# Patient Record
Sex: Female | Born: 1950 | Hispanic: Yes | Smoking: Never smoker
Health system: Southern US, Community
[De-identification: ages and names within clinical notes are randomized; demographics above are authoritative.]

## PROBLEM LIST (undated history)

## (undated) DIAGNOSIS — F22 Delusional disorders: Secondary | ICD-10-CM

## (undated) DIAGNOSIS — K219 Gastro-esophageal reflux disease without esophagitis: Secondary | ICD-10-CM

## (undated) DIAGNOSIS — M810 Age-related osteoporosis without current pathological fracture: Secondary | ICD-10-CM

## (undated) DIAGNOSIS — M8580 Other specified disorders of bone density and structure, unspecified site: Secondary | ICD-10-CM

## (undated) DIAGNOSIS — K149 Disease of tongue, unspecified: Secondary | ICD-10-CM

## (undated) DIAGNOSIS — E785 Hyperlipidemia, unspecified: Secondary | ICD-10-CM

## (undated) DIAGNOSIS — M1990 Unspecified osteoarthritis, unspecified site: Secondary | ICD-10-CM

## (undated) DIAGNOSIS — R7302 Impaired glucose tolerance (oral): Secondary | ICD-10-CM

## (undated) DIAGNOSIS — M5416 Radiculopathy, lumbar region: Secondary | ICD-10-CM

## (undated) HISTORY — DX: Impaired glucose tolerance (oral): R73.02

## (undated) HISTORY — DX: Hyperlipidemia, unspecified: E78.5

## (undated) HISTORY — PX: BREAST BIOPSY: SHX20

## (undated) HISTORY — DX: Delusional disorders: F22

## (undated) HISTORY — DX: Disease of tongue, unspecified: K14.9

## (undated) HISTORY — DX: Radiculopathy, lumbar region: M54.16

## (undated) HISTORY — DX: Age-related osteoporosis without current pathological fracture: M81.0

## (undated) HISTORY — DX: Other specified disorders of bone density and structure, unspecified site: M85.80

---

## 1983-09-20 HISTORY — PX: LIPOMA EXCISION: SHX5283

## 2000-06-20 ENCOUNTER — Encounter: Admission: RE | Admit: 2000-06-20 | Discharge: 2000-06-20 | Payer: Self-pay | Admitting: Family Medicine

## 2000-08-24 ENCOUNTER — Encounter: Admission: RE | Admit: 2000-08-24 | Discharge: 2000-08-24 | Payer: Self-pay | Admitting: Family Medicine

## 2000-09-29 ENCOUNTER — Encounter: Admission: RE | Admit: 2000-09-29 | Discharge: 2000-09-29 | Payer: Self-pay | Admitting: Family Medicine

## 2000-12-11 ENCOUNTER — Encounter: Admission: RE | Admit: 2000-12-11 | Discharge: 2000-12-11 | Payer: Self-pay | Admitting: Family Medicine

## 2004-02-24 ENCOUNTER — Other Ambulatory Visit: Admission: RE | Admit: 2004-02-24 | Discharge: 2004-02-24 | Payer: Self-pay | Admitting: Gynecology

## 2004-04-02 ENCOUNTER — Encounter (INDEPENDENT_AMBULATORY_CARE_PROVIDER_SITE_OTHER): Payer: Self-pay | Admitting: *Deleted

## 2004-04-02 ENCOUNTER — Encounter: Admission: RE | Admit: 2004-04-02 | Discharge: 2004-04-02 | Payer: Self-pay | Admitting: Surgery

## 2004-05-20 ENCOUNTER — Encounter: Admission: RE | Admit: 2004-05-20 | Discharge: 2004-05-20 | Payer: Self-pay | Admitting: Gastroenterology

## 2004-05-28 ENCOUNTER — Ambulatory Visit (HOSPITAL_COMMUNITY): Admission: RE | Admit: 2004-05-28 | Discharge: 2004-05-28 | Payer: Self-pay | Admitting: Gastroenterology

## 2004-05-28 HISTORY — PX: COLONOSCOPY: SHX174

## 2004-07-06 ENCOUNTER — Encounter: Admission: RE | Admit: 2004-07-06 | Discharge: 2004-07-06 | Payer: Self-pay | Admitting: Surgery

## 2005-01-25 ENCOUNTER — Encounter: Admission: RE | Admit: 2005-01-25 | Discharge: 2005-01-25 | Payer: Self-pay | Admitting: Surgery

## 2005-09-07 ENCOUNTER — Encounter: Admission: RE | Admit: 2005-09-07 | Discharge: 2005-09-07 | Payer: Self-pay | Admitting: Surgery

## 2006-03-27 ENCOUNTER — Encounter: Admission: RE | Admit: 2006-03-27 | Discharge: 2006-03-27 | Payer: Self-pay | Admitting: Surgery

## 2006-08-23 IMAGING — RF DG ESOPHAGUS
11 of 13 series · 20 of 24 positions shown · non-contrast
Comparison: none

CLINICAL DATA: Dysphagia. 
DOUBLE CONTRAST BARIUM ESOPHAGRAM: 
Pharynx is unremarkable.  Normal esophageal peristalsis is noted.  No fixed filling defects, areas of fixed narrowing or mucosal abnormalities identified.  No demonstrable episodes of gastroesophageal reflux are noted during the study.   12.5mm barium tablet passed through the esophagus into the stomach without evidence of obstruction.  The patient did have slight difficulty in transport of the tablet from the oral cavity into the pharynx.

[Series 1: run · 7 of 14 slices shown (1 of 11)]
[im 1/14]
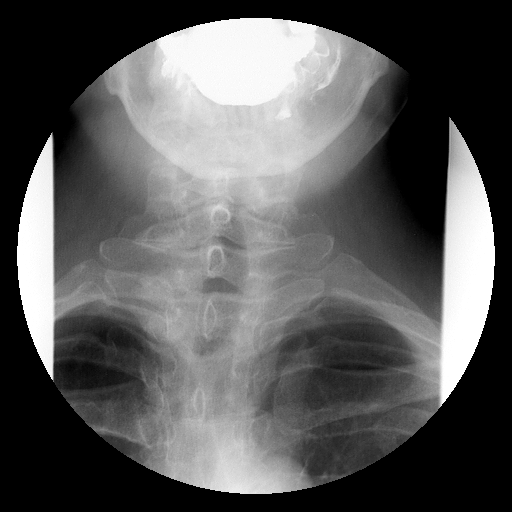
[im 2/14]
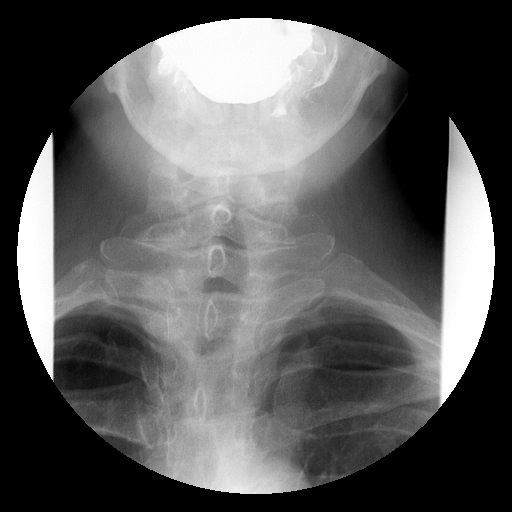
[im 6/14]
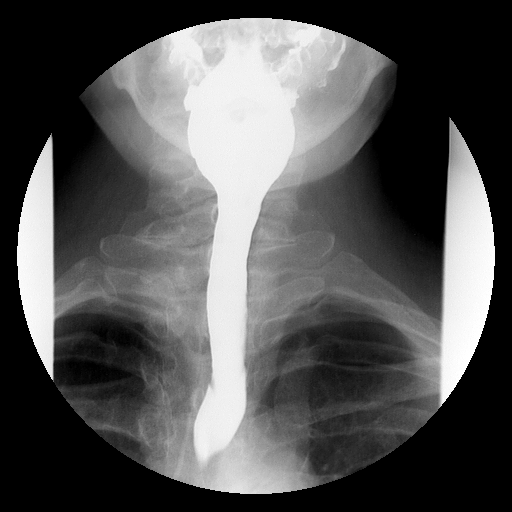
[im 8/14]
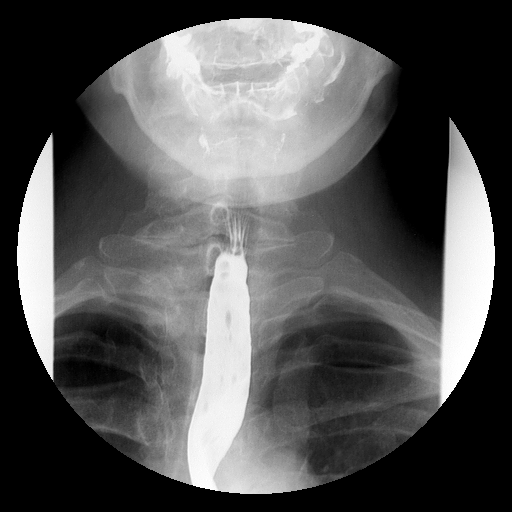
[im 10/14]
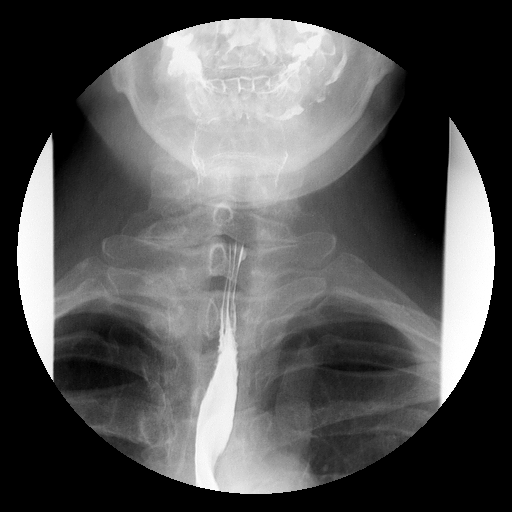
[im 12/14]
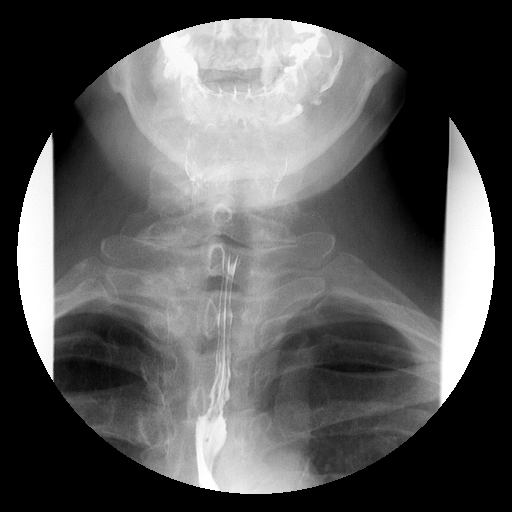
[im 14/14]
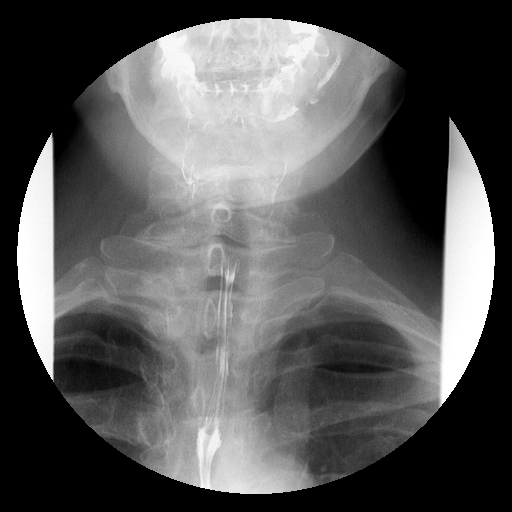

[Series 2: run · 4 of 10 slices shown (2 of 11)]
[im 3/10]
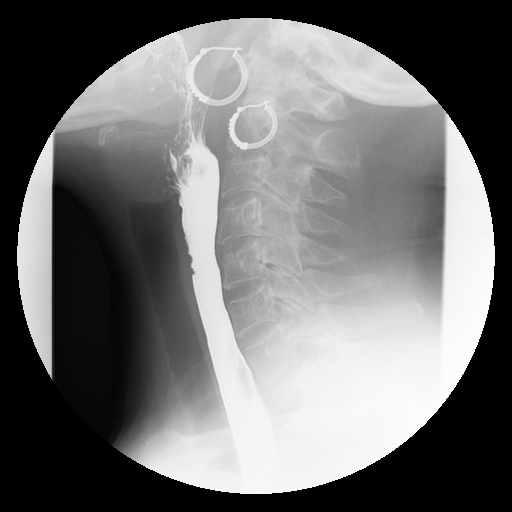
[im 5/10]
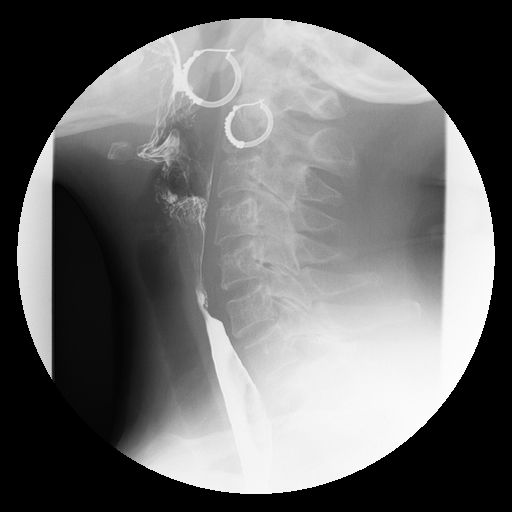
[im 7/10]
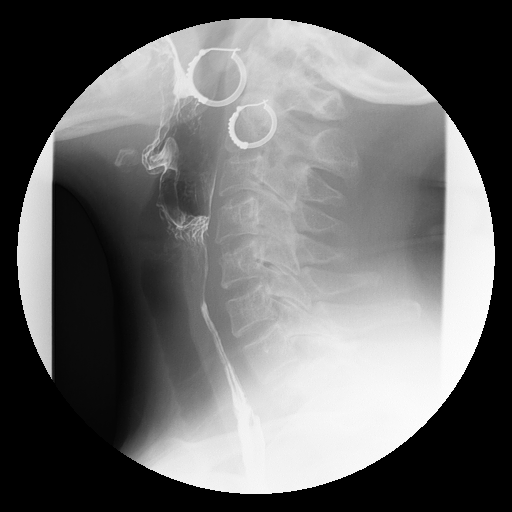
[im 10/10]
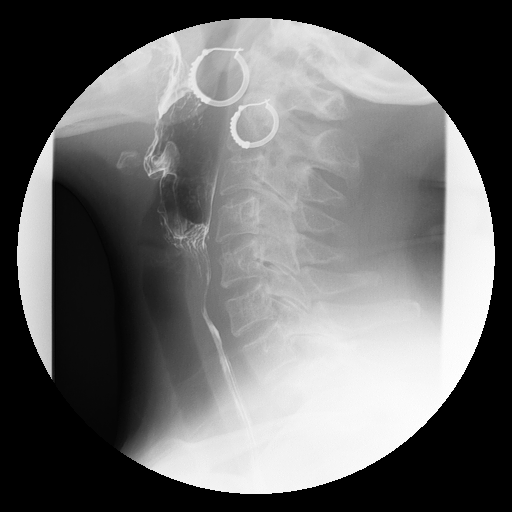

[Series 3: run · 1 of 1 slices shown (3 of 11)]
[im 1/1]
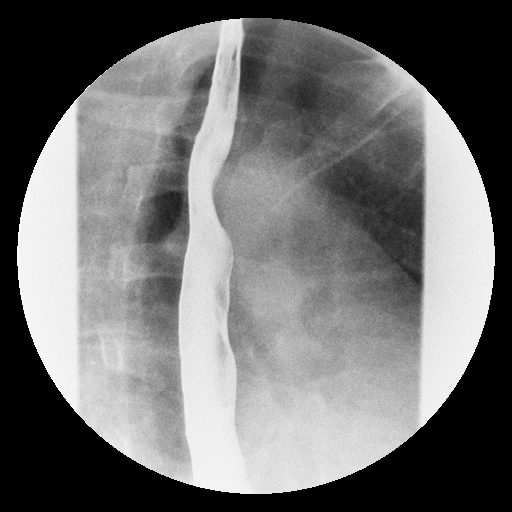

[Series 5: run · 1 of 1 slices shown (4 of 11)]
[im 1/1]
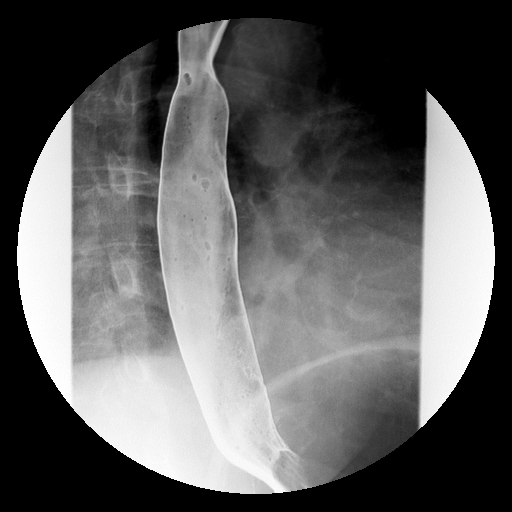

[Series 6: run · 1 of 1 slices shown (5 of 11)]
[im 1/1]
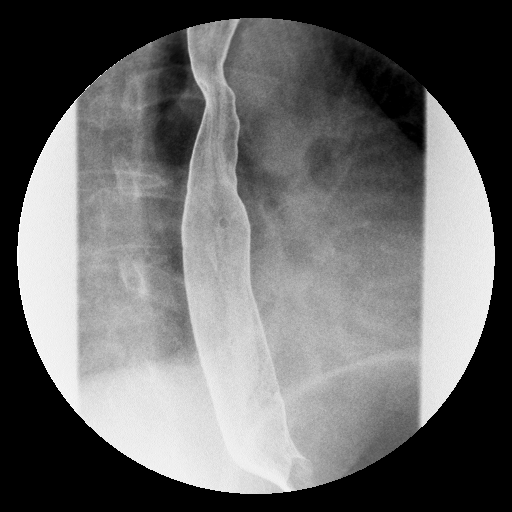

[Series 7: run · 1 of 1 slices shown (6 of 11)]
[im 1/1]
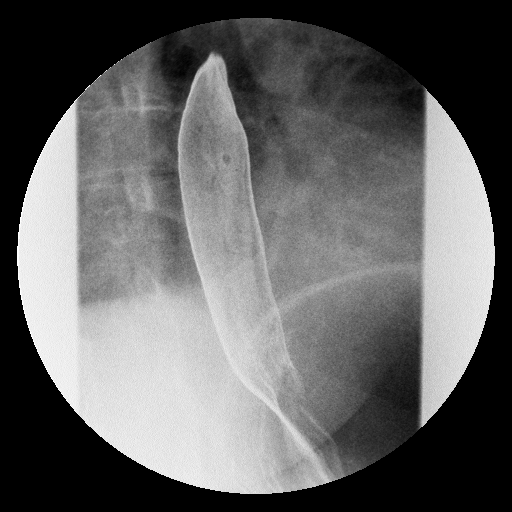

[Series 8: run · 1 of 1 slices shown (7 of 11)]
[im 1/1]
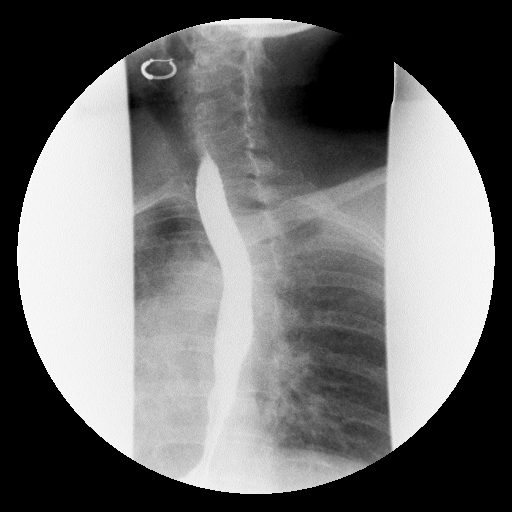

[Series 9: run · 1 of 1 slices shown (8 of 11)]
[im 1/1]
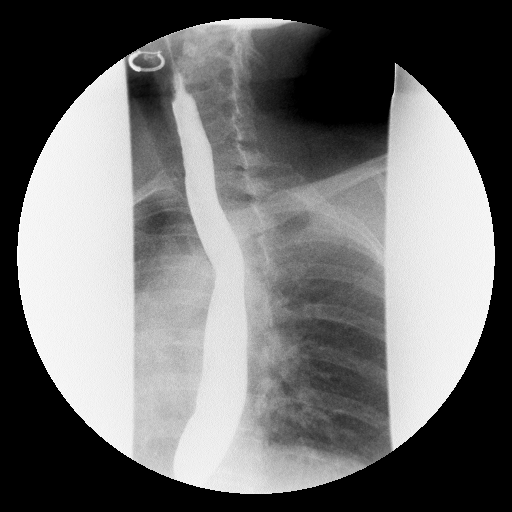

[Series 11: run · 1 of 1 slices shown (9 of 11)]
[im 1/1]
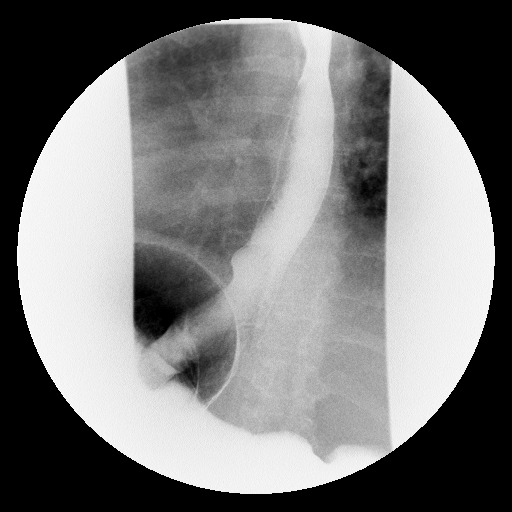

[Series 12: run · 1 of 1 slices shown (10 of 11)]
[im 1/1]
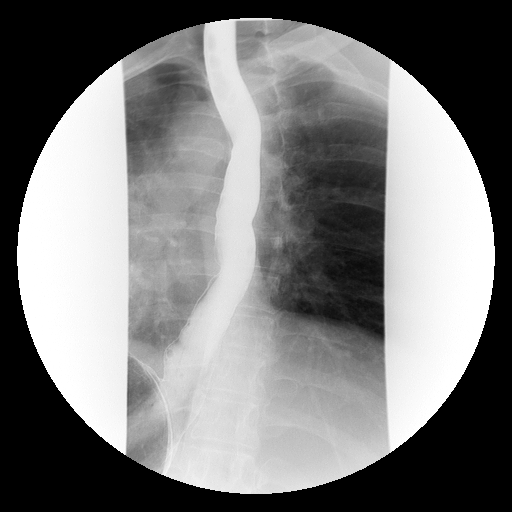

[Series 13: run · 1 of 1 slices shown (11 of 11)]
[im 1/1]
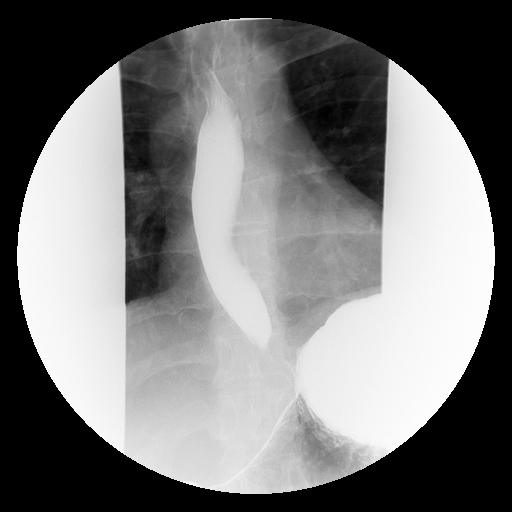

[20 of 24 positions shown; findings below may reference images not displayed]

IMPRESSION: Mild difficulty in oral-pharyngeal transport of barium tablet, otherwise unremarkable study.

## 2006-09-19 DIAGNOSIS — M8580 Other specified disorders of bone density and structure, unspecified site: Secondary | ICD-10-CM

## 2006-09-19 HISTORY — DX: Other specified disorders of bone density and structure, unspecified site: M85.80

## 2007-05-11 ENCOUNTER — Encounter: Admission: RE | Admit: 2007-05-11 | Discharge: 2007-05-11 | Payer: Self-pay | Admitting: Family Medicine

## 2008-06-02 ENCOUNTER — Ambulatory Visit (HOSPITAL_COMMUNITY): Admission: RE | Admit: 2008-06-02 | Discharge: 2008-06-02 | Payer: Self-pay | Admitting: Orthopedic Surgery

## 2008-07-25 ENCOUNTER — Encounter: Admission: RE | Admit: 2008-07-25 | Discharge: 2008-07-25 | Payer: Self-pay | Admitting: Family Medicine

## 2010-01-04 ENCOUNTER — Ambulatory Visit: Payer: Self-pay | Admitting: Family Medicine

## 2010-01-04 DIAGNOSIS — M810 Age-related osteoporosis without current pathological fracture: Secondary | ICD-10-CM | POA: Insufficient documentation

## 2010-01-04 DIAGNOSIS — B351 Tinea unguium: Secondary | ICD-10-CM

## 2010-01-04 DIAGNOSIS — M549 Dorsalgia, unspecified: Secondary | ICD-10-CM | POA: Insufficient documentation

## 2010-01-04 DIAGNOSIS — K219 Gastro-esophageal reflux disease without esophagitis: Secondary | ICD-10-CM | POA: Insufficient documentation

## 2010-01-20 ENCOUNTER — Encounter: Payer: Self-pay | Admitting: Family Medicine

## 2010-01-20 DIAGNOSIS — E663 Overweight: Secondary | ICD-10-CM

## 2010-01-21 ENCOUNTER — Encounter: Payer: Self-pay | Admitting: Family Medicine

## 2010-01-21 ENCOUNTER — Ambulatory Visit: Payer: Self-pay | Admitting: Family Medicine

## 2010-01-21 LAB — CONVERTED CEMR LAB
ALT: 51 units/L — ABNORMAL HIGH (ref 0–35)
AST: 36 units/L (ref 0–37)
Albumin: 4.5 g/dL (ref 3.5–5.2)
Alkaline Phosphatase: 59 units/L (ref 39–117)
BUN: 18 mg/dL (ref 6–23)
CO2: 25 meq/L (ref 19–32)
Calcium: 9 mg/dL (ref 8.4–10.5)
Chloride: 104 meq/L (ref 96–112)
Cholesterol: 197 mg/dL (ref 0–200)
Creatinine, Ser: 0.79 mg/dL (ref 0.40–1.20)
Glucose, Bld: 98 mg/dL (ref 70–99)
HDL: 53 mg/dL (ref >39)
LDL Cholesterol: 116 mg/dL — ABNORMAL HIGH (ref 0–99)
Potassium: 4.1 meq/L (ref 3.5–5.3)
Sodium: 139 meq/L (ref 135–145)
Total Bilirubin: 0.4 mg/dL (ref 0.3–1.2)
Total CHOL/HDL Ratio: 3.7
Total Protein: 7 g/dL (ref 6.0–8.3)
Triglycerides: 140 mg/dL (ref <150)
VLDL: 28 mg/dL (ref 0–40)

## 2010-02-08 ENCOUNTER — Ambulatory Visit: Payer: Self-pay | Admitting: Family Medicine

## 2010-02-08 ENCOUNTER — Encounter: Payer: Self-pay | Admitting: Family Medicine

## 2010-02-16 ENCOUNTER — Encounter: Payer: Self-pay | Admitting: Family Medicine

## 2010-02-16 LAB — CONVERTED CEMR LAB
ALT: 36 units/L — ABNORMAL HIGH (ref 0–35)
AST: 27 units/L (ref 0–37)
Albumin: 4.3 g/dL (ref 3.5–5.2)
Alkaline Phosphatase: 55 units/L (ref 39–117)
Bilirubin, Direct: 0.1 mg/dL (ref 0.0–0.3)
Indirect Bilirubin: 0.6 mg/dL (ref 0.0–0.9)
Total Bilirubin: 0.7 mg/dL (ref 0.3–1.2)
Total Protein: 7 g/dL (ref 6.0–8.3)

## 2010-02-22 ENCOUNTER — Ambulatory Visit: Payer: Self-pay | Admitting: Family Medicine

## 2010-02-22 ENCOUNTER — Encounter: Payer: Self-pay | Admitting: Family Medicine

## 2010-02-22 LAB — CONVERTED CEMR LAB: H Pylori IgG: NEGATIVE

## 2010-02-24 LAB — CONVERTED CEMR LAB
ALT: 46 units/L — ABNORMAL HIGH (ref 0–35)
AST: 37 units/L (ref 0–37)
Albumin: 4.6 g/dL (ref 3.5–5.2)
Alkaline Phosphatase: 56 units/L (ref 39–117)
Bilirubin, Direct: 0.2 mg/dL (ref 0.0–0.3)
HCV Ab: NEGATIVE
Hep B S Ab: NEGATIVE
Hepatitis B Surface Ag: NEGATIVE
Indirect Bilirubin: 0.7 mg/dL (ref 0.0–0.9)
Total Bilirubin: 0.9 mg/dL (ref 0.3–1.2)
Total Protein: 7.2 g/dL (ref 6.0–8.3)

## 2010-03-01 ENCOUNTER — Ambulatory Visit (HOSPITAL_COMMUNITY): Admission: RE | Admit: 2010-03-01 | Discharge: 2010-03-01 | Payer: Self-pay | Admitting: Family Medicine

## 2010-03-01 ENCOUNTER — Encounter: Payer: Self-pay | Admitting: Family Medicine

## 2010-03-08 ENCOUNTER — Ambulatory Visit: Payer: Self-pay | Admitting: Family Medicine

## 2010-03-08 ENCOUNTER — Encounter: Payer: Self-pay | Admitting: Family Medicine

## 2010-03-08 LAB — CONVERTED CEMR LAB
Ferritin: 193 ng/mL (ref 10–291)
HCT: 41.8 % (ref 36.0–46.0)
Hemoglobin: 14 g/dL (ref 12.0–15.0)
Iron: 96 ug/dL (ref 42–145)
MCHC: 33.5 g/dL (ref 30.0–36.0)
MCV: 91.1 fL (ref 78.0–100.0)
Platelets: 223 10*3/uL (ref 150–400)
RBC: 4.59 M/uL (ref 3.87–5.11)
RDW: 14 % (ref 11.5–15.5)
Saturation Ratios: 25 % (ref 20–55)
TIBC: 377 ug/dL (ref 250–470)
UIBC: 281 ug/dL
WBC: 6.5 10*3/uL (ref 4.0–10.5)

## 2010-03-23 ENCOUNTER — Ambulatory Visit: Payer: Self-pay | Admitting: Family Medicine

## 2010-04-05 ENCOUNTER — Ambulatory Visit (HOSPITAL_COMMUNITY): Admission: RE | Admit: 2010-04-05 | Discharge: 2010-04-05 | Payer: Self-pay | Admitting: Family Medicine

## 2010-06-14 ENCOUNTER — Ambulatory Visit: Payer: Self-pay | Admitting: Family Medicine

## 2010-06-14 DIAGNOSIS — G43909 Migraine, unspecified, not intractable, without status migrainosus: Secondary | ICD-10-CM | POA: Insufficient documentation

## 2010-06-14 DIAGNOSIS — R42 Dizziness and giddiness: Secondary | ICD-10-CM

## 2010-09-09 ENCOUNTER — Encounter: Payer: Self-pay | Admitting: Family Medicine

## 2010-10-10 ENCOUNTER — Encounter: Payer: Self-pay | Admitting: Family Medicine

## 2010-10-10 ENCOUNTER — Encounter: Payer: Self-pay | Admitting: Surgery

## 2010-10-11 ENCOUNTER — Ambulatory Visit: Admission: RE | Admit: 2010-10-11 | Discharge: 2010-10-11 | Payer: Self-pay | Source: Home / Self Care

## 2010-10-11 ENCOUNTER — Encounter: Payer: Self-pay | Admitting: Family Medicine

## 2010-10-11 DIAGNOSIS — R609 Edema, unspecified: Secondary | ICD-10-CM | POA: Insufficient documentation

## 2010-10-11 LAB — CONVERTED CEMR LAB: TSH: 1.406 microintl units/mL (ref 0.350–4.500)

## 2010-10-19 NOTE — Assessment & Plan Note (Signed)
Summary: VOMIT/EAR PROBLEM/MJ   Vital Signs:  Patient profile:   60 year old female Height:      62 inches Weight:      158 pounds BMI:     29.00 Temp:     98.1 degrees F oral Pulse rate:   76 / minute BP sitting:   139 / 76  (right arm) Cuff size:   regular  Vitals Entered By: Tessie Fass CMA (June 14, 2010 3:01 PM) CC: dizziness and vomiting x 6 days ago Pain Assessment Patient in pain? yes     Location: head   Primary Care Provider:  Zachery Dauer, MD  CC:  dizziness and vomiting x 6 days ago.  History of Present Illness: 60 yo F:  1. Dizziness, HA, N/V - x 6 days ago, none since. Patient states that she developed nausea and sensation of room spinning, then HA - left sided, pounding, with photo/phonophobia. This lasted a few hours, then resolved and she has had none since. Today, she denies HA, dizziness, vertigo, vision changes, CP, SOB, N/V/D/C, LE edema, rash. She states that this happened once before, about 11 years ago, and her left TM was found to be ruptured.  Current Medications (verified): 1)  Calcium 600/vitamin D 600-400 Mg-Unit Tabs (Calcium Carbonate-Vitamin D) .Marland Kitchen.. 1 Tab By Mouth Two Times A Day For Osteoporosis 2)  Loratadine 10 Mg Tabs (Loratadine) .... Sig: Take 1 Tab By Mouth One Time Daily Spanish Language Instructions 3)  Zegerid Otc 20-1100 Mg Caps (Omeprazole-Sodium Bicarbonate) .... Take One To Two Tabs Daily  Allergies (verified): No Known Drug Allergies PMH-FH-SH reviewed for relevance  Review of Systems      See HPI  Physical Exam  General:  Well-developed, well-nourished, in no acute distress; alert, appropriate and cooperative throughout examination. Vitals reviewed. Head:  Normocephalic and atraumatic without obvious abnormalities.  Eyes:  No corneal or conjunctival inflammation noted. EOMI. Perrla. Funduscopic exam benign, without hemorrhages, exudates or papilledema. Vision grossly normal. Ears:  R ear normal and L ear normal -  scar tissue on left TM.   Nose:  External nasal examination shows no deformity or inflammation. Nasal mucosa are pink and moist without lesions or exudates. Mouth:  Oral mucosa and oropharynx without lesions or exudates.  Teeth in good repair. Neck:  No deformities, masses, or tenderness noted. Lungs:  Normal respiratory effort, chest expands symmetrically. Lungs are clear to auscultation, no crackles or wheezes. Heart:  Normal rate and regular rhythm. S1 and S2 normal without gallop, murmur, click, rub or other extra sounds. Pulses:  2+ DP. Neurologic:  Alert & oriented X3, cranial nerves II-XII intact, strength normal in all extremities, sensation intact to light touch, and gait normal.     Impression & Recommendations:  Problem # 1:  MIGRAINE HEADACHE (ICD-346.90) Assessment Improved  Resolved. Red Flags given.  Orders: [facility]- Est Level  3 (01093)  Problem # 2:  VERTIGO (ICD-780.4) Assessment: Improved  Resolved. Red Flags given. Advised patient to follow-up during episode if this happens again. Her updated medication list for this problem includes:    Loratadine 10 Mg Tabs (Loratadine) ..... Sig: take 1 tab by mouth one time daily spanish language instructions  Orders: [facility]- Est Level  3 ([id number])  Complete Medication List: 1)  Calcium 600/vitamin D 600-400 Mg-unit Tabs (Calcium carbonate-vitamin d) .Marland Kitchen.. 1 tab by mouth two times a day for osteoporosis 2)  Loratadine 10 Mg Tabs (Loratadine) .... Sig: take 1 tab by mouth one time daily spanish  language instructions 3)  Zegerid Otc 20-1100 Mg Caps (Omeprazole-sodium bicarbonate) .... Take one to two tabs daily  Patient Instructions: 1)  It was nice to meet you today! 2)  Call if you have the dizziness, nausea, and vomiting again - you can come in for a work-in appointment. 3)  Go to the ER if you have drooping face, weakness, or cannot stop vomiting, or if you are having a severe, sudden headache. 4)  Fue Holiday representative. 5)   LLame si coninua con el mareo, nausea y vomito otra vez. 6)  Vaya a la sala de urgencia si la cara siente que se le cuelga,. no deja de vomitar, y si tiene un repentivo y Pharmacist, hospital de Training and development officer.

## 2010-10-19 NOTE — Miscellaneous (Signed)
Summary: Fasting Lipid Panel Order  Clinical Lists Changes  Problems: Added new problem of OVERWEIGHT (ICD-278.02) Orders: Added new Test order of Lipid-[facility] [id number]) - Signed Added new Test order of Comp Met-[facility] [phone]) - Signed

## 2010-10-19 NOTE — Letter (Signed)
Summary: Generic Letter  [facility]  [address]   [place], [place] [address]   Phone: [phone]  Fax: [phone]    02/16/2010  [facility] [address] [place], [place]  [address]  [place] MANCERA-ROSALES,   Espero que esta carta le [employer].  Acabo de revisar sus laboratorios de la semana pasada, y veo que la prueba del higado esta' un poco elevada todavia.    Quiero hacerle una prueba para hepatitis viral. Elfredia Nevins que pase por el consultorio cuando sea conveniente para [employer]; no tiene que estar en ayunas para [employer] prueba.   Por favor llame con cualquier pregunta.     Sinceramente,   Paula Compton MD  Appended Document: Generic Letter mailed.

## 2010-10-19 NOTE — Assessment & Plan Note (Signed)
Summary: F/U lab/mj   Vital Signs:  Patient profile:   60 year old female Height:      62 inches Weight:      155 pounds BMI:     28.45 Temp:     97.7 degrees F oral Pulse rate:   73 / minute BP sitting:   128 / 81  (right arm) Cuff size:   regular  Vitals Entered By: Tessie Fass CMA (February 22, 2010 11:06 AM) CC: F/U labs Is Patient Diabetic? No   Primary Care Provider:  Zachery Dauer, MD  CC:  F/U labs.  History of Present Illness: CC: f/u abnl ALT  Visit conducted in [place].   60yo with h/o elevated ALT (see previous note) and GERD.  1. ALT - elevation continues.  to get viral hepatitis panel today as per plan outlined by Dr. Mauricio Po last visit.  No abd pain.  No EtOH, smoking.  Father with cirrhosis.  No tattoos.  Never received a blood transfusion.   2. throat issue - h/o gerd, treated with omeprazole and protonix and flonase and nasonex.  didn't improve.  staying away from caffeine, sodas, spicy foods, mints, etc.  knows GERD precautions.  Using pillow underneath mattress.  Feels burning sensation and globus hystericus in throat, at times awakens hoarse.  No dysphagia.  + Postnasal drip.  Failed nasonex trial x 3 bottles.  Postponing start of oral Lamisil for her onychomycosis.   Due for mammogram (abnormality 2009 s/p normal biopsy).  Medications reviewed and updated in medication list.  Review of systems as above.   Habits & Providers  Alcohol-Tobacco-Diet     Alcohol drinks/day: 0     Tobacco Status: never     Tobacco Counseling: not indicated; no tobacco use  Exercise-Depression-Behavior     Drug Use: never  Current Medications (verified): 1)  Omeprazole 40 Mg Cpdr (Omeprazole) .Marland Kitchen.. 1 Tab By Mouth Daily For Gerd.  Spanish Instructions. 2)  Calcium 600/vitamin D 600-400 Mg-Unit Tabs (Calcium Carbonate-Vitamin D) .Marland Kitchen.. 1 Tab By Mouth Two Times A Day For Osteoporosis 3)  Loratadine 10 Mg Tabs (Loratadine) .... Sig: Take 1 Tab By Mouth One Time Daily Spanish  Language Instructions  Allergies (verified): No Known Drug Allergies  Past History:  Past medical, surgical, family and social histories (including risk factors) reviewed for relevance to current acute and chronic problems.  Past Medical History: isolated elevated AST h/o cyst right chest, again breast, s/p biopsy (last mammo 07/25/2008)  Family History: Reviewed history from 11/16/2006 and no changes required. cirrhosis - F died, Htn - M  Social History: Reviewed history from 01/04/2010 and no changes required. Employed at [employer].  Divorced.  Non-smoker, non-drinker. Never Smoked [employer] faith Education 9+ yearsDrug Use:  never  Physical Exam  General:  Well-developed,well-nourished,in no acute distress; alert,appropriate and cooperative throughout examination Ears:  External ear exam shows no significant lesions or deformities.  Otoscopic examination reveals clear canals, tympanic membranes are intact bilaterally without bulging, retraction, inflammation or discharge. Hearing is grossly normal bilaterally. Nose:  mucus disharge on walls of nasal turbinates.  No blood. No maxillary or frontal sinus tenderness.  swollen nasal turbinates Mouth:  Oral mucosa and oropharynx without lesions or exudates.  Teeth in good repair.  no pharyngeal abnormality Neck:  No deformities, masses, or tenderness noted. No anterior cervical adenopathy.  Lungs:  Normal respiratory effort, chest expands symmetrically. Lungs are clear to auscultation, no crackles or wheezes. Heart:  Normal rate and regular rhythm. S1 and  S2 normal without gallop, murmur, click, rub or other extra sounds.   Impression & Recommendations:  Problem # 1:  LIVER FUNCTION TESTS, ABNORMAL, HX OF (ICD-V12.2) check viral hepatitis panel.  rtc 2 wks for f/u.  may need baseline RUQ [place] and iron studies if viral hepatitis panel negative.  father with h/o non-EtOH cirrhosis.  Orders: Hep Bs Ab-[facility] ([id number]) Hep Bs Ag-[facility]  ([id number]) Hep C Ab-[facility] ([id number]) [facility]- Est  Level 4 ([id number])  Problem # 2:  GERD (ICD-530.81) H pylori negative.  unclear etiology of throat soreness/GERD which has been longstanding x 9 years.  consider ENT referral vs barium swallow.  no dysphagia.  all symptomatology stems from back of throat.  + PND but has failed what sounds like adequate trial of nasal steroids.  on loratadine.  also has tried saline sprays.  consider recommending neti pot.  Her updated medication list for this problem includes:    Omeprazole 40 Mg Cpdr (Omeprazole) .Marland Kitchen... 1 tab by mouth daily for gerd.  spanish instructions.  Orders: H pylori-[facility] ([id number]) [facility]- Est  Level 4 (28413)  Problem # 3:  ABNORMAL MAMMOGRAM (ICD-793.80) past due for mammogram.  advised to go to [facility] where there is translator services.  also as pt has debra [place].  Complete Medication List: 1)  Omeprazole 40 Mg Cpdr (Omeprazole) .Marland Kitchen.. 1 tab by mouth daily for gerd.  spanish instructions. 2)  Calcium 600/vitamin D 600-400 Mg-unit Tabs (Calcium carbonate-vitamin d) .Marland Kitchen.. 1 tab by mouth two times a day for osteoporosis 3)  Loratadine 10 Mg Tabs (Loratadine) .... Sig: take 1 tab by mouth one time daily spanish language instructions  Patient Instructions: 1)  Return in 2 wks for f/u. 2)  regresar conmigo o Dr. Mauricio Po o Dr. Sheffield Slider en 2 semanas para ver los resultados de examen viral de hepatitis. 3)  Marlana Latus en [facility]. 4)  Gusto conocerla hoy.  Llame a clinica con preguntas.  Laboratory Results   Blood Tests   Date/Time Received: February 22, 2010 12:22 PM  Date/Time Reported: February 22, 2010 1:01 PM    H. pylori: negative Comments: ...........test performed by...........Marland KitchenTerese Door, CMA

## 2010-10-19 NOTE — Assessment & Plan Note (Signed)
Summary: cpp/[place]/olson   Vital Signs:  Patient profile:   60 year old female Weight:      158 pounds Pulse rate:   72 / minute BP sitting:   130 / 70  (right arm)  Vitals Entered By: Arlyss Repress CMA, (Feb 08, 2010 2:18 PM) CC: f/up labs   Primary Care Provider:  Zachery Dauer, MD  CC:  f/up labs.  History of Present Illness: Visit conducted in [place].   Ms. Ann Chase comes in today for followup of labs done at last visit.  She reports that she continues to have the irritation in the back of her throat, "polvillo", which has not gone away since changing her meds at the last visit.  Denies stomach discomfort or discernible symptoms of reflux.  Says she gets irritated throat, provokes cough.  Nonproductive cough.  Has tried to limit consumption of onion, coffee and tea.  Not much improvement.  She says there may be a seasonal worsening of the symptoms during the winter months, although it's been pretty bad lately.    Of note her ALT is mildly elevated.  She does not know of this ever being found before.  She denies any prior history of viral hepatitis, although her father died of cirrhosis that was reportedly the result of viral hepatitis.  He never drank alcohol.  She has never been a smoker; no alcohol or drugs.  No tattoos.  Never received a blood transfusion.   She is interested in starting oral Lamisil for her onychomycosis.  Discussed why we might want to wait until better understanding of elevated transaminases.   Current Medications (verified): 1)  Omeprazole 40 Mg Cpdr (Omeprazole) .Marland Kitchen.. 1 Tab By Mouth Daily For Gerd.  Spanish Instructions. 2)  Calcium 600/vitamin D 600-400 Mg-Unit Tabs (Calcium Carbonate-Vitamin D) .Marland Kitchen.. 1 Tab By Mouth Two Times A Day For Osteoporosis 3)  Loratadine 10 Mg Tabs (Loratadine) .... Sig: Take 1 Tab By Mouth One Time Daily Spanish Language Instructions  Allergies (verified): No Known Drug Allergies  Physical Exam  General:   Well-developed,well-nourished,in no acute distress; alert,appropriate and cooperative throughout examination Eyes:  clear sclerae.  Injected conjunctivae bilat.  Ears:  External ear exam shows no significant lesions or deformities.  Otoscopic examination reveals clear canals, tympanic membranes are intact bilaterally without bulging, retraction, inflammation or discharge. Hearing is grossly normal bilaterally. Nose:  mucus disharge on walls of nasal turbinates.  No blood. No maxillary or frontal sinus tenderness.  Mouth:  Oral mucosa and oropharynx without lesions or exudates.  Teeth in good repair. Neck:  No deformities, masses, or tenderness noted. No anterior cervical adenopathy.  Lungs:  Normal respiratory effort, chest expands symmetrically. Lungs are clear to auscultation, no crackles or wheezes. Heart:  Normal rate and regular rhythm. S1 and S2 normal without gallop, murmur, click, rub or other extra sounds. Abdomen:  Bowel sounds positive,abdomen soft and non-tender without masses, organomegaly or hernias noted. Specifically, no hepatomegaly or masses noted on exam.   Impression & Recommendations:  Problem # 1:  LIVER FUNCTION TESTS, ABNORMAL, HX OF (ICD-V12.2) Patient wiht single elevated ALT value.  Only risk factor that can be identified is father wtih hepatitis cirrhosis.  Will recheck ALT today, if remains elevated even slightly, would check viral hepatitis panel (check for HCV, which sometimes has only presenting finding an elevated ALT). Orders: Hepatic-[facility] ([id number]) [facility]- Est Level  3 ([id number])  Problem # 2:  COUGH (ICD-786.2) Coughing symptoms that heretofore have been associated with GERD  symptoms.  She reports today that she does not get relief from omeprazole.  Suspect post-nasal symptoms, will treat with a nonsedating antihistamine and recheck at next visit.  Could consider Flonase or other nasal steroid from [facility] program as well.   Problem # 3:  ONYCHOMYCOSIS  (ICD-110.1) I explained to her that I would prefer to consider treatmentwith oral Lamisil once we have worked up the elevated transaminase value.  If normalizes with this check, then to begin treatment.  If still high, to proceed with workup and defer treatment of onychomycosis.  Orders: [facility]- Est Level  3 ([id number])  Complete Medication List: 1)  Omeprazole 40 Mg Cpdr (Omeprazole) .Marland Kitchen.. 1 tab by mouth daily for gerd.  spanish instructions. 2)  Calcium 600/vitamin D 600-400 Mg-unit Tabs (Calcium carbonate-vitamin d) .Marland Kitchen.. 1 tab by mouth two times a day for osteoporosis 3)  Loratadine 10 Mg Tabs (Loratadine) .... Sig: take 1 tab by mouth one time daily spanish language instructions  Patient Instructions: 1)  Fue un placer verle hoy.  Estamos chequeando las enzimas del higado otra vez, y le hago contacto o por carta o telefono [phone]) con los [place]. 2)  Si las pruebas salen bien, podemos iniciar el tratamiento para el hongo en las unas de los pies; si estan elevadas las enzimas del higado otra vez, vamos a hacer una investigacion mas profunda. 3)  Mande' una receta para Loratadina 10mg , para tomar una tableta cada manana a ver si mejora la sensacion de polvillo en la garganta.  Si no se resuelve, podriamos tratar con un espray nasal (Flonase), a ver si esta' disponible a traves del programa de MAP en [facility]. 4)  Quiero que vuelva en 4 a 6 meses para chequear lo de Administrator.  POR FAVOR TRAIGA CUALQUIER INFORMACION ACERCA DEL LUGAR DONDE LE HICIERON LA COLONOSCOPIA HACE 4 ANOS, TAMBIEN LA MAMOGRAFIA Y EL PAPANICOLAU. 5)  FOLLOW UP IN 4 TO 6 WEEKS WITH DR Mauricio Po. Prescriptions: LORATADINE 10 MG TABS (LORATADINE) SIG: Take 1 tab by mouth one time daily SPanish language instructions  #30 x 3   Entered and Authorized by:   Paula Compton MD   Signed by:   Paula Compton MD on 02/08/2010   Method used:   Electronically to        [facility] [address] (retail)        [phone]       [place], [place]  [address]       Ph: [phone]       Fax: [phone]   RxID:   [id number]

## 2010-10-19 NOTE — Assessment & Plan Note (Signed)
Summary: NP,tcb   Vital Signs:  Patient profile:   60 year old female Height:      62 inches Weight:      157 pounds BMI:     28.82 Pulse rate:   80 / minute BP sitting:   123 / 80  (right arm)  Vitals Entered By: Arlyss Repress CMA, (January 04, 2010 3:34 PM) CC: new pt. Is Patient Diabetic? No Pain Assessment Patient in pain? no        Primary Care Provider:  Zachery Dauer, MD  CC:  new pt..  History of Present Illness: 60 yo female presenting to establish care.  GERD (ICD-530.81) Describes acid and mucousy feeling in the throat for several years.  Has tried Ranitidine and Omeprazole (currently taking Omeprazole) without substantial relief.  Pain makes it difficult to sleep at night at times, though the stomach and throat hurt day and night.  No cough, occasional dyspnea and nasal congestion.  Does not drink alcohol or use tobacco, but does drink sodas and juice several times a day, and enjoys chocolate and mints.  Also uses Alendronate by mouth for osteoporosis, which exacerbates symptoms (see below).  OSTEOPOROSIS (ICD-733.00) Formerly taking Alendronate 70 mg by mouth weekly, but is out.  She says this causes terrible stomach and esophagus burning when she takes it.  ONYCHOMYCOSIS (ICD-110.1) Bilateral toenails.  Taking no medicine for this.  Used to use a cream without relief.  BACK PAIN (ICD-724.5) Resolved.  In the past received injections in the back which were very costly but provided no relief.  Has since gone to the chiropractor  ~8 times and has gotten good relief from this.  Today denies symptoms.  Habits & Providers  Alcohol-Tobacco-Diet     Tobacco Status: never     Tobacco Counseling: to quit use of tobacco products  Current Medications (verified): 1)  Omeprazole 40 Mg Cpdr (Omeprazole) .Marland Kitchen.. 1 Tab By Mouth Daily For Gerd.  Spanish Instructions. 2)  Calcium 600/vitamin D 600-400 Mg-Unit Tabs (Calcium Carbonate-Vitamin D) .Marland Kitchen.. 1 Tab By Mouth Two Times A Day  For Osteoporosis  Allergies (verified): No Known Drug Allergies  Social History:    Employed at [employer].  Divorced.  Non-smoker, non-drinker.    Never Smoked    Catholic faith    Education 9+ years    Smoking Status:  never  Physical Exam  General:  Well-developed,well-nourished,in no acute distress; alert,appropriate and cooperative throughout examination Ears:  External ear exam shows no significant lesions or deformities.  Otoscopic examination reveals clear canals, tympanic membranes are intact bilaterally without bulging, retraction, inflammation or discharge. Hearing is grossly normal bilaterally. Nose:  External nasal examination shows no deformity or inflammation. Nasal mucosa are pink and moist without lesions or exudates. Mouth:  Oral mucosa and oropharynx without lesions or exudates. Neck:  No deformities, masses, or tenderness noted. Lungs:  Normal respiratory effort, chest expands symmetrically. Lungs are clear to auscultation, no crackles or wheezes. Heart:  Normal rate and regular rhythm. S1 and S2 normal without gallop, murmur, click, rub or other extra sounds. Abdomen:  Bowel sounds positive,abdomen soft and non-tender without masses, organomegaly or hernias noted. Extremities:  Onychomycosis x10 toenails.   Impression & Recommendations:  Problem # 1:  GERD (ICD-530.81) Assessment Unchanged Discussed dietary changes:  no caffiene, sodas, chocolate, mint for 1 month, then try to add back slowly to see which ones are tolerable and which ones exacerbate symptoms.  Will discontinue Alendronate for Osteoporosis and start Boniva, see #2  below.  Continue PPI. Her updated medication list for this problem includes:    Omeprazole 40 Mg Cpdr (Omeprazole) .Marland Kitchen... 1 tab by mouth daily for gerd.  spanish instructions.  Problem # 2:  OSTEOPOROSIS (ICD-733.00) Assessment: Unchanged Unclear what T-score is.  Would discontinue Alendronate given severe GERD symptoms.  Consider Boniva  IV q52months, but would ensure that dental work is up to date.  Start Calcium/Vitamin D two times a day now.  Her updated medication list for this problem includes:    Calcium 600/vitamin D 600-400 Mg-unit Tabs (Calcium carbonate-vitamin d) .Marland Kitchen... 1 tab by mouth two times a day for osteoporosis  Problem # 3:  ONYCHOMYCOSIS (ICD-110.1) Assessment: New Would start Terbinafine when she returns after qualifying with Rudell Cobb, as will require CMet.  Problem # 4:  BACK PAIN (ICD-724.5) Assessment: Improved No intervention at this time, patient sees chiropractor and is happy with results.  Problem # 5:  Preventive Health Care (ICD-V70.0) Assessment: Comment Only Due for Pap, Colonoscopy, Mammogram, FLP.  Complete Medication List: 1)  Omeprazole 40 Mg Cpdr (Omeprazole) .Marland Kitchen.. 1 tab by mouth daily for gerd.  spanish instructions. 2)  Calcium 600/vitamin D 600-400 Mg-unit Tabs (Calcium carbonate-vitamin d) .Marland Kitchen.. 1 tab by mouth two times a day for osteoporosis  Patient Instructions: 1)  Please see Rudell Cobb to qualify for reduced or free medical services within the [facility].  Call her at [phone] today. 2)  Please schedule a follow-up appointment in 2 weeks for Pap, fasting lipid panel. Prescriptions: OMEPRAZOLE 40 MG CPDR (OMEPRAZOLE) 1 tab by mouth daily for GERD.  Spanish Instructions.  #1 x 3   Entered by:   Romero Belling MD   Authorized by:   Zachery Dauer MD   Signed by:   Romero Belling MD on 01/04/2010   Method used:   Print then Give to Patient   RxID:   [id number]     Prevention & Chronic Care Immunizations   Influenza vaccine: Not documented    Tetanus booster: Not documented    Pneumococcal vaccine: Not documented  Colorectal Screening   Hemoccult: Not documented    Colonoscopy: Not documented  Other Screening   Pap smear: Not documented   Pap smear due: 01/04/2010    Mammogram: Not documented   Mammogram due: 01/04/2010   Smoking status: never   (01/04/2010)  Lipids   Total Cholesterol: Not documented   LDL: Not documented   LDL Direct: Not documented   HDL: Not documented   Triglycerides: Not documented        Social History:    Employed at [employer].  Divorced.  Non-smoker, non-drinker.    Never Smoked    Catholic faith    Education 9+ years    Smoking Status:  never

## 2010-10-19 NOTE — Letter (Signed)
Summary: Results Follow-up Letter  [facility]  [address]   [place], [place] [address]   Phone: [phone]  Fax: [phone]    03/01/2010  [address] [place], [place]  [address]  Dear [name],   The following are the results of your recent test(s):  Test     Result     Su sonograma abdominal salio normal.  Los riones y el higado se veian completamente normales.  Me gustaria que usted regrese a clinica para International aid/development worker su nivel de hierro en la sangre - esto a veces puede causar [employer] del laboratorio del higado esten un poco elevados.  Solo necesita venir para una visita de laboratorio, y despues si puede hacer cita con el doctor en 1 semana despues del laboratorio.  Espero que su garganta este mejor con la nueva medicina Nexium.  Llame a clinica con preguntas.  Sincerely,  Eustaquio Boyden  MD [facility]           Appended Document: Results Follow-up Letter letter mailed.

## 2010-10-19 NOTE — Assessment & Plan Note (Signed)
Summary: fu/mj/ red team   Vital Signs:  Patient profile:   60 year old female Height:      62 inches Weight:      160 pounds BMI:     29.37 Temp:     98.0 degrees F oral Pulse rate:   93 / minute BP sitting:   115 / 72  (left arm) Cuff size:   regular  Vitals Entered By: Tessie Fass CMA (March 23, 2010 4:05 PM)  Primary Care Provider:  Zachery Dauer, MD   History of Present Illness: She continues to have discomfort in her throat with thick mucous. No nasal congestion or cough and medications for allergic rhinitis did not help. She continues on Prevacid that she buys over the counter and has raised the head of her bed 6 inches. Denies dysphagia or change in her voice. Will get heartburn if she doesn't take the PPI. Saw an ENT whose name she can't remember 3 years ago when she still had insurance and no specific problem was found. The problem began 9 years ago and 2 months before she left [place] so she can't relate it to any job exposures.   Allergies: No Known Drug Allergies  Social History: Employed at [employer].  Divorced.  Non-smoker, non-drinker. Lives alone [place] of [place] Never Smoked Catholic faith Education 9+ years  Physical Exam  General:  Well-developed,well-nourished,in no acute distress; alert,appropriate and cooperative throughout examination Eyes:  no injection.   Ears:  External ear exam shows no significant lesions or deformities.  Otoscopic examination reveals clear canals, tympanic membranes are intact bilaterally without bulging, retraction, inflammation or discharge. Hearing is grossly normal bilaterally. Nose:  No mucus disharge on walls of nasal turbinates.  No blood.  Mouth:  Oral mucosa and oropharynx without lesions or exudates.  Teeth in good repair.  no pharyngeal abnormality Neck:  No deformities, masses, or tenderness noted. No anterior cervical adenopathy.  Lungs:  Normal respiratory effort, chest expands symmetrically. Lungs are clear to  auscultation, no crackles or wheezes. Heart:  Normal rate and regular rhythm. S1 and S2 normal without gallop, murmur, click, rub or other extra sounds. Psych:  not anxious appearing but mildly dysphoric affect.     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 One daily for throat. label in spanish Her updated medication list for this problem includes:    Zegerid Otc 20-1100 Mg Caps (Omeprazole-sodium bicarbonate) .Marland Kitchen... Take one to two tabs daily  Orders: [facility]- Est Level  3 (98119)  Problem # 3:  OVERWEIGHT (ICD-278.02)  Orders: [facility]- Est Level  3 (14782)  Complete Medication List: 1)  Calcium 600/vitamin D 600-400 Mg-unit Tabs (Calcium carbonate-vitamin d) .Marland Kitchen.. 1 tab by mouth two times a day for osteoporosis 2)  Loratadine 10 Mg Tabs (Loratadine) .... Sig: take 1 tab by mouth one time daily spanish language instructions 3)  Zegerid Otc 20-1100 Mg Caps (Omeprazole-sodium bicarbonate) .... Take one to two tabs daily  Patient Instructions: 1)  Trate Zegerid para malestar de la garganta. Puede comprar sin receta.  2)  Llamenos con el nombre de la especialista de la garganta.  3)  Call us with the name of the ENT if you want to see him again.

## 2010-10-21 NOTE — Miscellaneous (Signed)
  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

## 2010-10-21 NOTE — Assessment & Plan Note (Signed)
Summary: Ankle swelling/mj   Vital Signs:  Patient profile:   60 year old female Weight:      161.3 pounds Pulse rate:   72 / minute BP sitting:   130 / 80  (right arm)  Vitals Entered By: Arlyss Repress CMA, (October 11, 2010 3:46 PM) CC: swelling of ankles Is Patient Diabetic? No Pain Assessment Patient in pain? no        Primary Care Provider:  Zachery Dauer, MD  CC:  swelling of ankles.  History of Present Illness: 1. Leg swelling:  Pt has had leg swelling off and on for about 5-6 years.  She came in today because it has been going on for so long.  Described as a little bit of swelling around her feet and ankles after she has been on her feet for a long time.  It is also worse when the weather is warm out.    ROS: denies chest pain, shortness of breath, LE pain / redness / warmth  2. Back pain:  This is a chronic condition.  Diagnosed with a herniated disc many years ago.  She had been going to physical therapy that included acupuncture and ultrasound.  It helped but she hasn't been there in months and her back pain is starting to hurt again.  ROS: denies numbness / weakness, radiating pain, loss of bowel / bladder function  Habits & Providers  Alcohol-Tobacco-Diet     Tobacco Status: never  Current Medications (verified): 1)  Calcium 600/vitamin D 600-400 Mg-Unit Tabs (Calcium Carbonate-Vitamin D) .Marland Kitchen.. 1 Tab By Mouth Two Times A Day For Osteoporosis 2)  Loratadine 10 Mg Tabs (Loratadine) .... Sig: Take 1 Tab By Mouth One Time Daily Spanish Language Instructions 3)  Zegerid Otc 20-1100 Mg Caps (Omeprazole-Sodium Bicarbonate) .... Take One To Two Tabs Daily  Allergies: No Known Drug Allergies  Past History:  Past Medical History: Reviewed history from 02/22/2010 and no changes required. isolated elevated AST h/o cyst right chest, again breast, s/p biopsy (last mammo 07/25/2008)  Social History: Reviewed history from 03/23/2010 and no changes required. Employed at  [employer].  Divorced.  Non-smoker, non-drinker. Lives alone [place] of [place] Never Smoked Catholic faith Education 9+ years  Physical Exam  General:  Vitals reviewed.  Well appearing.  No acute distress Mouth:  MMM Neck:  no JVD Lungs:  Normal respiratory effort, chest expands symmetrically. Lungs are clear to auscultation, no crackles or wheezes. Heart:  Normal rate and regular rhythm. S1 and S2 normal without gallop, murmur, click, rub or other extra sounds. Msk:  Low back:  no swelling/redness/deformity.  Full ROM.  Non-tender. Pulses:  2+ DP pulses bilaterally.  Brisk cap refill Extremities:  trace LE edema Neurologic:  strength normal in all extremities, sensation intact to light touch, and gait normal.   Skin:  turgor normal.     Impression & Recommendations:  Problem # 1:  LEG EDEMA, BILATERAL (ICD-782.3) Assessment New Likely benign LE swelling.  No signs of cardiac dysfunction.  Will check TSH.  Advised compression stockings. Orders: [facility] ([id number]) [facility]- Est  Level 4 ([address]11)  Problem # 2:  BACK PAIN (ICD-724.5) Assessment: Deteriorated Worse over the past couple of months.  She was never taught any specific back exercises that she can do.  Will send to PT for some back exercise training. Orders: Physical Therapy Referral (PT) [facility]- Est  Level 4 (91478)  Complete Medication List: 1)  Calcium 600/vitamin D 600-400 Mg-unit Tabs (Calcium carbonate-vitamin d) .Marland KitchenMarland KitchenMarland Kitchen  1 tab by mouth two times a day for osteoporosis 2)  Loratadine 10 Mg Tabs (Loratadine) .... Sig: take 1 tab by mouth one time daily spanish language instructions 3)  Zegerid Otc 20-1100 Mg Caps (Omeprazole-sodium bicarbonate) .... Take one to two tabs daily  Patient Instructions: 1)  We will get you set up with physical therapy for your back 2)  I am going to check your thyroid function to see if that is related to your leg swelling 3)  It is likely related to you holding on to too much fluid 4)  The  best way to manage it is with compression (elastic) stockings 5)  Please schedule a follow up appointment in 3 months   Orders Added: 1)  Physical Therapy Referral [PT] 2)  [facility] [[id number]] 3)  [facility]- Est  Level 4 [[address]]

## 2010-11-01 ENCOUNTER — Ambulatory Visit: Payer: Self-pay | Attending: Family Medicine | Admitting: Physical Therapy

## 2010-11-01 DIAGNOSIS — M542 Cervicalgia: Secondary | ICD-10-CM | POA: Insufficient documentation

## 2010-11-01 DIAGNOSIS — IMO0001 Reserved for inherently not codable concepts without codable children: Secondary | ICD-10-CM | POA: Insufficient documentation

## 2010-11-01 DIAGNOSIS — M545 Low back pain, unspecified: Secondary | ICD-10-CM | POA: Insufficient documentation

## 2010-11-01 DIAGNOSIS — M25659 Stiffness of unspecified hip, not elsewhere classified: Secondary | ICD-10-CM | POA: Insufficient documentation

## 2010-11-03 ENCOUNTER — Ambulatory Visit: Payer: Self-pay | Admitting: Physical Therapy

## 2010-11-08 ENCOUNTER — Encounter: Payer: Self-pay | Admitting: *Deleted

## 2010-11-08 ENCOUNTER — Ambulatory Visit: Payer: Self-pay | Admitting: Physical Therapy

## 2010-11-10 ENCOUNTER — Ambulatory Visit: Payer: Self-pay | Admitting: Physical Therapy

## 2010-11-15 ENCOUNTER — Ambulatory Visit: Payer: Self-pay | Admitting: Physical Therapy

## 2010-11-17 ENCOUNTER — Ambulatory Visit: Payer: Self-pay | Admitting: Physical Therapy

## 2010-11-22 ENCOUNTER — Ambulatory Visit: Payer: Self-pay | Attending: Family Medicine | Admitting: Physical Therapy

## 2010-11-22 DIAGNOSIS — M545 Low back pain, unspecified: Secondary | ICD-10-CM | POA: Insufficient documentation

## 2010-11-22 DIAGNOSIS — M25659 Stiffness of unspecified hip, not elsewhere classified: Secondary | ICD-10-CM | POA: Insufficient documentation

## 2010-11-22 DIAGNOSIS — M542 Cervicalgia: Secondary | ICD-10-CM | POA: Insufficient documentation

## 2010-11-22 DIAGNOSIS — IMO0001 Reserved for inherently not codable concepts without codable children: Secondary | ICD-10-CM | POA: Insufficient documentation

## 2010-11-24 ENCOUNTER — Ambulatory Visit: Payer: Self-pay | Admitting: Physical Therapy

## 2011-01-07 ENCOUNTER — Encounter: Payer: Self-pay | Admitting: Family Medicine

## 2011-01-07 ENCOUNTER — Ambulatory Visit (INDEPENDENT_AMBULATORY_CARE_PROVIDER_SITE_OTHER): Payer: Self-pay | Admitting: Family Medicine

## 2011-01-07 DIAGNOSIS — K219 Gastro-esophageal reflux disease without esophagitis: Secondary | ICD-10-CM

## 2011-01-07 DIAGNOSIS — R609 Edema, unspecified: Secondary | ICD-10-CM

## 2011-01-07 DIAGNOSIS — J309 Allergic rhinitis, unspecified: Secondary | ICD-10-CM

## 2011-01-07 DIAGNOSIS — J302 Other seasonal allergic rhinitis: Secondary | ICD-10-CM | POA: Insufficient documentation

## 2011-01-07 MED ORDER — OMEPRAZOLE-SODIUM BICARBONATE 20-1100 MG PO CAPS: 1.0000 | ORAL_CAPSULE | Freq: Two times a day (BID) | ORAL | Status: DC

## 2011-01-07 NOTE — Assessment & Plan Note (Signed)
Chronic problems.  Says that she has had this for 10 years.  She is not taking the Omeprazole everyday.  Advised her to start taking it twice a day and see if that helps with her symptoms.  If not improved with PPI BID I would consider sending her for an EGD.

## 2011-01-07 NOTE — Assessment & Plan Note (Signed)
Unchanged.  Only trace LE edema.  Not using any compression stockings.  Advised her to get some.

## 2011-01-07 NOTE — Progress Notes (Signed)
  Subjective:    Patient ID: Ann Chase, female    DOB: 1951/05/24, 60 y.o.   MRN: [id number]  HPI  1. LE swelling:  Mostly around her ankles.  Unchanged compared to last visit.  Hasn't been using compression stockings  2. GERD:  Pt is still having the acid taste in her mouth.  Described by her as having "fluid" in the back of her throat.  She only takes the Omeprazole when she feels like she needs it  3. Allergies:  Using the Claritin.  She feels like it dries up her nose.  Denies itchy, watery eyes or runny nose  Review of Systems Denies chest pain, shortness of breath, fevers, abdominal pain, rectal bleeding, headache, sinus pain / pressure    Objective:   Physical Exam  Constitutional: She appears well-nourished. No distress.  HENT:  Nose: Nose normal.  Mouth/Throat: Oropharynx is clear and moist. No oropharyngeal exudate.       OP pink.    Eyes: Conjunctivae are normal.  Neck: Normal range of motion. Neck supple. No JVD present. No thyromegaly present.  Cardiovascular: Normal rate, regular rhythm and normal heart sounds.   No murmur heard. Pulmonary/Chest: Effort normal and breath sounds normal. No respiratory distress. She has no wheezes.       No crackles  Abdominal: Soft. Bowel sounds are normal. She exhibits no distension. There is no tenderness.  Musculoskeletal: Normal range of motion.       Trace ankle edema bilaterally  Lymphadenopathy:    She has no cervical adenopathy.  Skin: Skin is warm and dry. No pallor.  Psychiatric: She has a normal mood and affect.          Assessment & Plan:

## 2011-01-07 NOTE — Patient Instructions (Signed)
The fluid that you are describing seems like it is coming from your stomach You need to take the medicine Omeprazole twice a day everyday.  30 minutes before breakfast and dinner. If you are still having problems we may need to send you to have a look down into your stomach Your thyroid test was normal Please schedule a follow up appointment in 3 months

## 2011-02-04 NOTE — Op Note (Signed)
NAMEALBERTINA, Ann Chase                ACCOUNT NO.:  [id number]   MEDICAL RECORD NO.:  [id number]                   PATIENT TYPE:  AMB   LOCATION:  ENDO                                 FACILITY:  [facility]   PHYSICIAN:  Anselmo Rod, M.D.               DATE OF BIRTH:  1951/08/25   DATE OF PROCEDURE:  05/28/2004  DATE OF DISCHARGE:                                 OPERATIVE REPORT   PROCEDURE PERFORMED:  Screening colonoscopy.   ENDOSCOPIST:  Anselmo Rod, M.D.   INSTRUMENT USED:  Olympus video colonoscope.   INDICATIONS FOR PROCEDURE:  A 60 year old Hispanic female with history of  constipation undergoing a screening colonoscopy to rule out colonic polyps,  masses, etc.   PREPROCEDURE PREPARATION:  Informed consent was procured from the patient.  The patient fasted for eight hours prior to the procedure and prepped with a  bottle of magnesium citrate and a gallop of GoLYTELY the night prior to the  procedure.   PREPROCEDURE PHYSICAL:  VITAL SIGNS:  Stable vital signs.  NECK:  Supple.  CHEST:  Clear to auscultation.  CARDIOVASCULAR:  S1 and S2 regular.  ABDOMEN:  Soft with normal bowel sounds.   DESCRIPTION OF PROCEDURE:  The patient was placed in left lateral decubitus  position, sedated with 75 mg of Demerol and 7.5 mg of Versed intravenously  in slow incremental doses.  Once the patient was adequately sedated and  maintained on low flow oxygen and continuous cardiac monitoring, the Olympus  video colonoscope was advanced from the rectum to the cecum.  The  appendiceal orifice and ileocecal valve were clearly visualized and  photographed.  Small internal hemorrhoids were appreciated on retroflexion.  No masses,  polyps, erosions, ulcerations, or diverticula were seen.   IMPRESSION:  Normal colonoscopy up to the cecum except for small internal  hemorrhoids.  No masses or polyps seen.  No evidence of diverticulosis.   RECOMMENDATIONS:  1.  Continue a high fiber  diet with fluid intake.  Avoid laxatives.  Use      stool softeners as needed.  2.  Repeat colonoscopy in the next 10 years unless the patient develops any      abnormal symptoms in the interim.  3.  Outpatient follow-up in the next two weeks if need be.                                               Anselmo Rod, M.D.    JNM/MEDQ  D:  05/28/2004  T:  05/28/2004  Job:  [id number]   cc:   Gaetano Hawthorne. Lily Peer, M.D.  [address]  [place]  [place] [address]  Fax: [phone]

## 2011-02-04 NOTE — Op Note (Signed)
Ann Chase, Ann Chase                ACCOUNT NO.:  [id number]   MEDICAL RECORD NO.:  [id number]                   PATIENT TYPE:  AMB   LOCATION:  ENDO                                 FACILITY:  [facility]   PHYSICIAN:  Anselmo Rod, M.D.               DATE OF BIRTH:  1950/10/21   DATE OF PROCEDURE:  05/28/2004  DATE OF DISCHARGE:                                 OPERATIVE REPORT   PROCEDURE:  Esophagogastroduodenoscopy.   ENDOSCOPIST:  Charna Elizabeth, M.D.   INSTRUMENT USED:  Olympus video panendoscope.   INDICATIONS FOR PROCEDURE:  60 year old Hispanic female with a history of  dysphagia.  She had a barium swallow that showed some difficulty in  oropharyngeal transfer but was not very diagnostic.  An EGD is being done to  rule out esophagitis, achalasia, etc.   PREPROCEDURE PREPARATION:  Informed consent was obtained from the patient.  The patient was fasted for eight hours prior to the procedure.   PREPROCEDURE PHYSICAL:  Patient with stable vital signs.  Neck supple.  Chest clear to auscultation.  S1 and S2 regular.  Abdomen soft with normal  bowel sounds.   DESCRIPTION OF PROCEDURE:  The patient was placed in the left lateral  decubitus position, sedated with Demerol and Versed for the colonoscopy, no  additional sedation was used for the EGD.  Once the patient was adequately  sedated, maintained on low flow oxygen and continuous cardiac monitoring,  the Olympus video panendoscope was advanced through the mouth piece over the  tongue into the esophagus under direct vision.  The esophagus seemed  somewhat dilated.  The rest of the exam was normal.  The Z-line appeared  healthy and without lesion.  Retroflexion in the high cardia revealed no  abnormalities.  The proximal small bowel was normal, as well.   IMPRESSION:  Somewhat dilated esophagus, otherwise, normal EGD to rule out  achalasia.   RECOMMENDATIONS:  Esophageal manometry will be planned for the patient.  Further recommendations thereafter.  He is to follow up in the office after  esophageal manometry has been done.                                               Anselmo Rod, M.D.    JNM/MEDQ  D:  05/28/2004  T:  05/28/2004  Job:  [id number]   cc:   Gaetano Hawthorne. Lily Peer, M.D.  [address], [address]  [place]  [place] [address]  Fax: [phone]

## 2011-06-10 ENCOUNTER — Encounter: Payer: Self-pay | Admitting: Family Medicine

## 2011-06-10 ENCOUNTER — Ambulatory Visit (INDEPENDENT_AMBULATORY_CARE_PROVIDER_SITE_OTHER): Payer: Self-pay | Admitting: Family Medicine

## 2011-06-10 DIAGNOSIS — Z711 Person with feared health complaint in whom no diagnosis is made: Secondary | ICD-10-CM

## 2011-06-10 MED ORDER — PERMETHRIN 5 % EX CREA: TOPICAL_CREAM | Freq: Once | CUTANEOUS | Status: AC

## 2011-06-10 NOTE — Patient Instructions (Signed)
Return in 1-2 weeks

## 2011-06-10 NOTE — Assessment & Plan Note (Addendum)
Patient informed that particles she brought were not dead bugs.  Patient insisted that something be done. It was very distressed. Insured patient I do believe what she is telling me. Patient requesting permethrin. I explained to patient that I do not suspect scabies, but agreed to prescribe this since patient is very distressed, and I did not feel the permethrin would do harm. Encouraged patient to clean home well. And a spray for any possible infestation. We'll not he would treat a specific insect, since I have not found any on exam. Excoriations on back should improve with time. Patient to return in one week to ensure that this is improved.

## 2011-06-10 NOTE — Progress Notes (Signed)
  Subjective:    Patient ID: Ann Chase, female    DOB: 1951-01-07, 61 y.o.   MRN: [id number]  HPI Interview conducted in Spanish  Bugs in hair and on the body: Patient states that she has noticed bugs that her dead falling out of her hair on on to the shoulders of her blouse x1 week. She describes the bugs is very small and dark in color. She found some of these bugs in her bed as well. She also has some scratches on her back and is not sure where they came from. She denies any itching on her back. Patient does not have any pets. Patient has no history of house infestation. Patient has no history of same complaint. No contacts with this problem.  Patient brought some of these bugs in a plastic container for them to be inspected.   Review of Systems As per above    Objective:   Physical Exam  Constitutional: She is oriented to person, place, and time. She appears well-developed and well-nourished.  HENT:  Head: Normocephalic and atraumatic.       No lice or other insects found on inspection of scalp. No nits. No insects on skin exam  Cardiovascular: Normal rate.   Pulmonary/Chest: Effort normal. No respiratory distress.  Musculoskeletal: She exhibits no edema.       Back exam: Across upper back, linear excoriations in the pattern of scratch marks. These areas are not itching. No tenderness on palpation.  Neurological: She is alert and oriented to person, place, and time.  Psychiatric:       Patient is very distressed by this symptom she experiencing.   Microscopic exam: Grossly some of the particles that she felt were bedbugs-were actually lint from material. So the question of particles were observed under microscope and were not found the insects.       Assessment & Plan:

## 2011-06-20 ENCOUNTER — Ambulatory Visit: Payer: Self-pay | Admitting: Family Medicine

## 2011-09-20 DIAGNOSIS — F22 Delusional disorders: Secondary | ICD-10-CM

## 2011-09-20 DIAGNOSIS — R7303 Prediabetes: Secondary | ICD-10-CM

## 2011-09-20 HISTORY — DX: Prediabetes: R73.03

## 2011-09-20 HISTORY — DX: Delusional disorders: F22

## 2012-04-11 ENCOUNTER — Ambulatory Visit: Payer: Self-pay | Admitting: Emergency Medicine

## 2012-04-11 VITALS — BP 134/82 | HR 71 | Temp 97.7°F | Resp 17 | Ht 62.0 in | Wt 142.0 lb

## 2012-04-11 DIAGNOSIS — R5381 Other malaise: Secondary | ICD-10-CM

## 2012-04-11 DIAGNOSIS — B852 Pediculosis, unspecified: Secondary | ICD-10-CM

## 2012-04-11 DIAGNOSIS — R5383 Other fatigue: Secondary | ICD-10-CM

## 2012-04-11 NOTE — Progress Notes (Signed)
Date:  04/11/2012   Name:  Ann Chase   DOB:  [birth year]   MRN:  [id number]  PCP:  DE Michel Bickers, MD    Chief Complaint: Rash, Anxiety and Depression   History of Present Illness:  Ann Chase is a 61 y.o. very pleasant female patient who presents with the following:  Woman with complaints of microscopic parasites that eat her skin and when they are "off" her body they sparkle.  She has had her house treated twice by an exterminator and they confirm that she has a sparkly look to her furniture. She was first treated for this with Haldol 0.3 mg in October and in March she was increased to 5 mg after an incremental increase to 0.5 mg.  She took 3 of the 5 mg and developed anxiety, depression, fatigue, and weight loss despite dropping her back down to 0.5 mg.  She comes in today because of a persistent dryness in her skin and hair, "microscopic parasites" in her hair that she can see and fatigue and depression.  She has been treated once for head lice and the treatment failed.  She claimed that she has dropped 20 pounds but it is not substantiated in the medical records nor has she needed to buy a different sized clothing.  Patient Active Problem List  Diagnosis  . ONYCHOMYCOSIS  . OVERWEIGHT  . MIGRAINE HEADACHE  . GERD  . BACK PAIN  . OSTEOPOROSIS  . VERTIGO  . LEG EDEMA, BILATERAL  . Seasonal allergies  . Physically well but worried    No past medical history on file.  No past surgical history on file.  History  Substance Use Topics  . Smoking status: Never Smoker   . Smokeless tobacco: Not on file  . Alcohol Use: Not on file    No family history on file.  No Known Allergies  Medication list has been reviewed and updated.  Current Outpatient Prescriptions on File Prior to Visit  Medication Sig Dispense Refill  . Calcium Carbonate-Vitamin D (CALCIUM 600+D) 600-400 MG-UNIT per tablet Take 1 tablet by mouth 2 (two) times daily.        .  haloperidol (HALDOL) 5 MG tablet Take 5 mg by mouth 2 (two) times daily.      Marland Kitchen lisinopril-hydrochlorothiazide (PRINZIDE,ZESTORETIC) 10-12.5 MG per tablet Take 1 tablet by mouth daily.      Marland Kitchen loratadine (CLARITIN) 10 MG tablet Take 10 mg by mouth daily.        Maxwell Caul Bicarbonate (ZEGERID OTC) 20-1100 MG CAPS Take 1 capsule by mouth 2 (two) times daily before a meal.  60 each  6    Review of Systems: As per HPI, otherwise negative.    Physical Examination: Filed Vitals:   04/11/12 1444  BP: 134/82  Pulse: 71  Temp: 97.7 F (36.5 C)  Resp: 17   Filed Vitals:   04/11/12 1444  Height: 5\' 2"  (1.575 m)  Weight: 142 lb (64.411 kg)   Body mass index is 25.97 kg/(m^2). Ideal Body Weight: Weight in (lb) to have BMI = 25: 136.4    GEN: WDWN, NAD, Non-toxic, Alert & Oriented x 3 HEENT: Atraumatic, Normocephalic.  Ears and Nose: No external deformity. EXTR: No clubbing/cyanosis/edema NEURO: Normal gait. Not apparently hallucinating.  Coherent, normal flow and content of speech. PSYCH: Normally interactive. Conversant. Not depressed or anxious appearing.  Calm demeanor.  Skin:  Eczema left forearm with excoriations. Scalp:  Possible nits in hair shafts and several poppy  seed sized objects in scalp.   Assessment and Plan: Head lice OTC treatment Eczema Adverse effect of haldol  To discontinue  Apparently she was given a prescription by her FMD for lisinopril with HCTZ in place of her usual medication for onychomycosis as well as an incorrect dose of haldol.  She asked me for a letter indicating that her physician had made med errors and I declined.  A TSH was drawn and she was offered a steroid cream for her eczema and she declined it.  She was offered treatment for her nits and she declined it as well as a referral to dermatology  She will follow up in two weeks  Carmelina Dane, MD

## 2012-04-13 ENCOUNTER — Encounter: Payer: Self-pay | Admitting: *Deleted

## 2013-02-04 ENCOUNTER — Encounter: Payer: Self-pay | Admitting: Family Medicine

## 2013-02-04 ENCOUNTER — Ambulatory Visit (INDEPENDENT_AMBULATORY_CARE_PROVIDER_SITE_OTHER): Payer: Self-pay | Admitting: Family Medicine

## 2013-02-04 VITALS — BP 129/76 | HR 80 | Temp 99.5°F | Ht 62.0 in | Wt 149.0 lb

## 2013-02-04 DIAGNOSIS — L299 Pruritus, unspecified: Secondary | ICD-10-CM

## 2013-02-04 DIAGNOSIS — K146 Glossodynia: Secondary | ICD-10-CM | POA: Insufficient documentation

## 2013-02-04 DIAGNOSIS — L853 Xerosis cutis: Secondary | ICD-10-CM

## 2013-02-04 DIAGNOSIS — K1321 Leukoplakia of oral mucosa, including tongue: Secondary | ICD-10-CM

## 2013-02-04 DIAGNOSIS — L738 Other specified follicular disorders: Secondary | ICD-10-CM

## 2013-02-04 MED ORDER — HYDROXYZINE HCL 25 MG PO TABS: 25.0000 mg | ORAL_TABLET | Freq: Three times a day (TID) | ORAL | Status: DC | PRN

## 2013-02-04 MED ORDER — DIPHENHYD-LIDOCAINE-NYSTATIN MT SUSP: 1.0000 "application " | Freq: Every day | OROMUCOSAL | Status: DC

## 2013-02-04 NOTE — Assessment & Plan Note (Signed)
Patient mostly concerned about white particles on her T-shirt and she would like me to look at the sample.  Unfortunately, Ann Chase is not here this week to interpret KOH prep.  Advised patient to return next week and bring sample so we can study it.  In the meantime, sent Rx for Atarax to pharmacy and encouraged use of head and shoulders shampoo.  Follow up in one week.  Also, patient to request MR from derm to be sent to our office.

## 2013-02-04 NOTE — Assessment & Plan Note (Signed)
Continue Nystatin daily.  Follow up in one week.

## 2013-02-04 NOTE — Progress Notes (Signed)
  Subjective:    Patient ID: Ann Chase, female    DOB: Feb 24, 1951, 62 y.o.   MRN: [id number]  HPI  Interpreter did not come, so language line was used.  Patient presents to same day appointment for skin problems.  She was last seen by dermatologist about 3 months ago and she is unable to tell me the diagnosis.  Describes skin problem as itching all over body and "dust all over body."  She has a T-shirt with "dust" on it and would like me to tell her what it is.  She has had this skin problem for 1.5 year ago.  She complains of itching of face and entire body.  She has not tried any oral medications.  She has tried bathing in water and vinegar.  Patient does not recall hx of eczema.  She denies any rash or redness or lesions on her body.    She was given a medication but she did not bring it with her.  She says it is a cream for itching.   She also complains of white film on her tongue that has been there for several months.  She has Nystatin suspension with her.  Denies any fever, nausea or vomiting, difficulty breathing.  Normal appetite.  Review of Systems Per HPI    Objective:   Physical Exam  Constitutional: No distress.  HENT:  Mouth/Throat: Oropharynx is clear and moist.  Tongue has thick white film, but no oral ulcers or lesions  Skin:  No obvious erythema, redness, or rash on face or body; she has small scabs on arms that have healed; no dermatomal rash appreciated; no burrowing lesions on fingers  Psychiatric: Her mood appears anxious.       Assessment & Plan:

## 2013-02-18 ENCOUNTER — Ambulatory Visit (INDEPENDENT_AMBULATORY_CARE_PROVIDER_SITE_OTHER): Payer: Self-pay | Admitting: Family Medicine

## 2013-02-18 ENCOUNTER — Encounter: Payer: Self-pay | Admitting: Family Medicine

## 2013-02-18 VITALS — BP 133/84 | HR 72 | Temp 99.7°F | Ht 62.0 in | Wt 152.0 lb

## 2013-02-18 DIAGNOSIS — L219 Seborrheic dermatitis, unspecified: Secondary | ICD-10-CM | POA: Insufficient documentation

## 2013-02-18 DIAGNOSIS — F40298 Other specified phobia: Secondary | ICD-10-CM

## 2013-02-18 DIAGNOSIS — F22 Delusional disorders: Secondary | ICD-10-CM

## 2013-02-18 DIAGNOSIS — L218 Other seborrheic dermatitis: Secondary | ICD-10-CM

## 2013-02-18 DIAGNOSIS — K1321 Leukoplakia of oral mucosa, including tongue: Secondary | ICD-10-CM

## 2013-02-18 DIAGNOSIS — L299 Pruritus, unspecified: Secondary | ICD-10-CM

## 2013-02-18 MED ORDER — PIMOZIDE 2 MG PO TABS: 2.0000 mg | ORAL_TABLET | Freq: Every day | ORAL | Status: DC

## 2013-02-18 MED ORDER — SALICYLIC ACID 3 % EX SHAM: MEDICATED_SHAMPOO | CUTANEOUS | Status: DC

## 2013-02-18 NOTE — Progress Notes (Signed)
  Subjective:    Patient ID: Ann Chase, female    DOB: 06-25-1951, 62 y.o.   MRN: [id number]  HPI # Tongue problem She feels grains on her tongue and a white coating.  This has been going on for 2 years.   Meds tried: -Nytatin: did not help   She was seen 05/19 for this problem.  She was advised to use Head and Shoulders shampoo and Atarax for skin problem see below>>>She says she tried 2 bottles of shampoo and she did not fill Atarax because she used in the past and did not help She feels like these two issues today are not different today than at her last visit.   # Skin problems She feels like animals are crawling on her skin sometimes.  She does not have these symptoms today but she has significant white flakes coming out of her scalp, and she sometimes notices "glitter" on her skin.  Symptoms have been occurring intermittently for the past 2 years.  She has seen 3 dermatologists in the past, most recently December 2013.  She was told she did not have infestation. She feels like they did not examine her.   Meds tried:  -Clobetasol solution on her scalp did not help -Haldol: did not help but actually made her feel bad  -Atarax: did not help in the past -Doxepin: 10 mg qd did not help  HOW MUCH DOES THIS PROBLEM HER:  "I can't stand these symptoms".   WHAT WOULD SHE LIKE? She wonders if she can get imaging (x-ray) to look under her skin and at her veins.   Review of Systems Per HPI Denies nausea, fevers/chills   Allergies, medication, past medical history reviewed.  Smoking status noted.     Objective:   Physical Exam GEN: NAD; well-nourished, -appearing HEAD: no dandruff; no lesions in scalp; non0tender TONGUE: white coating on superior surface of tongue; no erythema of gums, no well circumscribed white lesions or other lesions; no hyperpigmented lesions; no  SKIN: cherry angiomas few on forearms; nonspecific healed hyperpigmented macular circular  lesions (1-2 mm) on upper back; no other lesions including on wrists, between finger webs; no excoriations; no flaky lesions on eyebrows NEURO: postural tremor    Assessment & Plan:

## 2013-02-18 NOTE — Patient Instructions (Addendum)
Trate el champu para caspa Masaje el champu en pelo  Deja por 2-3 minutos.  Limpia el champu.   Haga [employer] veces diario por la semana para la caspa.   Y trate el medicamento para su condition cuando se siente comezon el el cuero y la piel.  Una tableta antes de dormia diario.      Aplique para la tarjeta de naranja por favor para saber si puede ayudar con el precio de las citas.

## 2013-02-18 NOTE — Assessment & Plan Note (Signed)
See "delusison of parasitosis" A/P

## 2013-02-18 NOTE — Assessment & Plan Note (Addendum)
We discussed how sometimes unclear etiology. She does not appear to have worrisome findings (for malignancy, infection) at this time. She has already tried Nystatin without relief; no obvious yeast or gingivitis. We will monitor for now.

## 2013-02-18 NOTE — Assessment & Plan Note (Signed)
For the past 2 years, sensation of pruritus on skin and scalp. She is convinced she has "animals" on her skin.  She has seen 3 dermatologists in the past; not diagnosed with infestation.  She has tried steroid cream for scalp, Haldol, Doxepin, Atarax without significant relief of symptoms.  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

## 2013-08-07 ENCOUNTER — Ambulatory Visit: Payer: Self-pay

## 2014-07-15 ENCOUNTER — Other Ambulatory Visit (HOSPITAL_COMMUNITY): Payer: Self-pay | Admitting: Physician Assistant

## 2014-07-15 DIAGNOSIS — Z1231 Encounter for screening mammogram for malignant neoplasm of breast: Secondary | ICD-10-CM

## 2014-07-23 ENCOUNTER — Ambulatory Visit (HOSPITAL_COMMUNITY)
Admission: RE | Admit: 2014-07-23 | Discharge: 2014-07-23 | Disposition: A | Discharge status: Home / Self Care | Payer: Self-pay | Source: Ambulatory Visit | Attending: Physician Assistant | Admitting: Physician Assistant

## 2014-07-23 DIAGNOSIS — Z1231 Encounter for screening mammogram for malignant neoplasm of breast: Secondary | ICD-10-CM

## 2015-06-24 ENCOUNTER — Ambulatory Visit: Payer: Self-pay | Admitting: Internal Medicine

## 2015-06-25 ENCOUNTER — Ambulatory Visit: Payer: Self-pay | Admitting: Internal Medicine

## 2015-07-02 ENCOUNTER — Ambulatory Visit (INDEPENDENT_AMBULATORY_CARE_PROVIDER_SITE_OTHER): Payer: Self-pay | Admitting: Internal Medicine

## 2015-07-02 ENCOUNTER — Encounter: Payer: Self-pay | Admitting: Internal Medicine

## 2015-07-02 VITALS — BP 122/78 | HR 80 | Ht 62.0 in | Wt 149.0 lb

## 2015-07-02 DIAGNOSIS — L853 Xerosis cutis: Secondary | ICD-10-CM

## 2015-07-02 DIAGNOSIS — L218 Other seborrheic dermatitis: Secondary | ICD-10-CM

## 2015-07-02 DIAGNOSIS — R682 Dry mouth, unspecified: Secondary | ICD-10-CM

## 2015-07-02 DIAGNOSIS — L219 Seborrheic dermatitis, unspecified: Secondary | ICD-10-CM

## 2015-07-02 NOTE — Progress Notes (Signed)
   Subjective:    Patient ID: Ann Chase, female    DOB: 11/23/1950, 64 y.o.   MRN: [id number]  HPI Here to follow up on sense she has "lint" on her tongue.  Not clear from looking at old records exactly what her delusions or paranoia was in relation to in past.   Pt. States her skin was itchy and had "little diamonds"  Or what sounds like her description of flaky skin, her scalp was itchy and hair was dry--she would feel like something was moving on her face or scalp and would feel itchy there.  And, she had this sense that she had "lint"  On her tongue.  Was treated multiple times for oral thrush, which did not help.  See below. 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  Pt. Feels her complaints above ultimately led to her being diagnosed with schizophrenia(delusions of parasitosis) for which she was eventually placed on antipsychotics.  Sounds like she was on Haldol 0.5 mg orally for 9 months.  Did not tolerate the increase to 5 mg (she states it was a mistake)  Suffered terrible depression with the 5 mg and only took it one day.    Sounds like went to another physician at what used to be [place], antipsychotics were discontinued and ultimately, sent to dermatologist (though appears she went to 3 dermatologists.)  At last visit, no abnormal findings of tongue, which was her only complaint at the time, but still with sense of lint on her tongue.  Did start Vitamin B Complex.    Denies itching and dryness of hands, arms, face, scalp.  Did have itching on low pretibial area of left leg 2 days ago.  Does not use hydrating cream on skin other than hands.  Showers daily with warm water only.  No special shampoo.  No conditioner for hair.  Using Dial or Palmolive soap.    Tongue is without change.  Feels dry, as do lips. Denies eye dryness (or only occasionally).  Does not note red or watery eyes.  Eyes not pruritic.  Is supposed  to wear glasses, but often forgets.    Also, shows a dark shirt with fair amount of flaking she states comes from her scalp.  Has tried Head and Shoulders without any improvement.  Denies other dandruff shampoo usage in past.    Review of Systems     Objective:   Physical Exam  Scalp:  Possibly mild flaking behind ears and nape--but not pronounced Mouth and tongue:  No abnormality today Skin:  No findings      Assessment & Plan:  1.  Dry Mouth:  No findings today again.  Pt. Wipes tongue on a black cloth and shows me the dry saliva on the cloth--appears she feels this is abnormal.  Discussed is normal.  Encouraged increase water intake.  Previously, A1C was at 6.0%, but lost significant weight.  Will check again to be sure this is not an element of her dry mouth at next OV.  2.  Dry Skin:  Hydrating lotion after bathing and at bedtime.  Dove soap.  Avoid hot showers.  3.  Flaking scalp:  Probable mild seborrhea.  To try otc Nizoral shampoo daily

## 2015-07-02 NOTE — Patient Instructions (Addendum)
No regadera caliente Use Dove o Caress habon Use Eucerin crema para eczema y con [place]vena despues de regadera todos los dias y Mongoliatambien en la noche Use crema antes que la piel se seca Tome 8 vasos de agua todos los dias Nizoral champu todos los dias--deja para diez minutos entonces enjuage Use artificial tears a necesita para oho seca

## 2015-07-03 ENCOUNTER — Ambulatory Visit: Payer: Self-pay | Admitting: Internal Medicine

## 2015-09-02 ENCOUNTER — Encounter: Payer: Self-pay | Admitting: Internal Medicine

## 2015-09-02 ENCOUNTER — Ambulatory Visit (INDEPENDENT_AMBULATORY_CARE_PROVIDER_SITE_OTHER): Payer: Self-pay | Admitting: Internal Medicine

## 2015-09-02 VITALS — BP 128/76 | HR 68 | Ht 61.0 in | Wt 141.0 lb

## 2015-09-02 DIAGNOSIS — H04123 Dry eye syndrome of bilateral lacrimal glands: Secondary | ICD-10-CM

## 2015-09-02 DIAGNOSIS — R682 Dry mouth, unspecified: Secondary | ICD-10-CM

## 2015-09-02 DIAGNOSIS — K219 Gastro-esophageal reflux disease without esophagitis: Secondary | ICD-10-CM

## 2015-09-02 DIAGNOSIS — R739 Hyperglycemia, unspecified: Secondary | ICD-10-CM

## 2015-09-02 DIAGNOSIS — L219 Seborrheic dermatitis, unspecified: Secondary | ICD-10-CM

## 2015-09-02 NOTE — Patient Instructions (Addendum)
Tome un vaso de agua antes de cada comida Tome un minimo de 6 a 8 vasos de agua diarios Coma tres veces al dia Coma una proteina y [name] grasa saludable con comida.  (huevos, pescado, pollo, pavo, y limite carnes rojas Coma 5 porciones diarias de legumbres.  Mezcle los colores Coma 2 porciones diarias de frutas con cascara cuando sea comestible Use platos pequeos Suelte su tenedor o cuchara despues de cada mordida hata que se mastique y se trague Come en la mesa con amigos o familiares por lo menos una vez al dia Apague la televisin y aparatos electrnicos durante la comida  Su objetivo debe ser perder [name] libra por semana   Enfermedad por reflujo gastroesofgico en los adultos (Gastroesophageal Reflux Disease, Adult) Normalmente, los alimentos descienden por el esfago y se depositan en el estmago para su digestin. Sin embargo, cuando una persona tiene enfermedad por reflujo gastroesofgico (ERGE), los alimentos y el cido estomacal regresan al esfago. Cuando esto ocurre, el esfago se irrita y se inflama. Con el tiempo, la ERGE puede provocar la formacin de pequeas perforaciones (lceras) en la mucosa del esfago.  CAUSAS Un problema del msculo que se encuentra entre el esfago y Investment banker, corporate (esfnter esofgico inferior o EEI) es la causa de esta enfermedad. Por lo general, el esfnter esofgico inferior se cierra despus de que los alimentos pasan a travs del esfago hacia el [place]. Cuando el EEI est debilitado o no es normal, no se cierra correctamente, lo que permite el paso retrgrado de los alimentos y el cido estomacal al esfago. Algunas sustancias de la dieta, algunos medicamentos y Materials engineer enfermedades pueden debilitar este esfnter, entre ellos:  Consumo de tabaco.  [place].  Hernia de hiato.  Consumo excesivo de alcohol.  Algunos alimentos y [address], como el caf, el chocolate, las cebollas y Interior and spatial designer. FACTORES DE RIESGO Es ms probable que esta  afeccin se manifieste en:  Los personas con sobrepeso.  Las personas con trastornos del tejido conjuntivo.  Las personas que toman antiinflamatorios no esteroides (AINE). SNTOMAS Los sntomas de esta afeccin incluyen lo siguiente:  Merchant navy officer.  Dificultad o dolor al tragar.  Sensacin de Warehouse manager un bulto en la garganta.  Sabor amargo en la boca.  Mal aliento.  Gran cantidad de saliva.  Malestar estomacal o meteorismo.  Flatulencias.  Dolor en el pecho.  Falta de aire o sibilancias.  Tos permanente (crnica) o tos nocturna.  Desgaste el esmalte dental.  Prdida de peso. El dolor en el pecho puede deberse a muchas afecciones diferentes. Consulte al mdico si tiene Journalist, newspaper. DIAGNSTICO El mdico le har una historia clnica y un examen fsico. Para determinar si la ERGE es leve o grave, el mdico tambin puede controlar la respuesta al [place]. Tambin pueden hacerle otros estudios, por ejemplo:  Una endoscopia para examinar el estmago y el esfago con [name] pequea cmara.  Un estudio que determina el nivel de acidez en el esfago.  Un estudio que mide la presin que hay en el esfago.  Un estudio de deglucin de bario o un estudio modificado de deglucin de bario para mostrar la forma, el tamao y el funcionamiento del esfago. TRATAMIENTO El objetivo del tratamiento es aliviar los sntomas y Automotive engineer las complicaciones. El tratamiento de esta afeccin puede variar en funcin de la gravedad de los sntomas. El mdico podr indicar lo siguiente:  Cambios en la dieta.  Medicamentos.  [place]. INSTRUCCIONES PARA EL CUIDADO EN EL HOGAR Dieta  Siga la dieta que le haya recomendado el mdico, la cual puede incluir evitar alimentos y bebidas tales como:  Caf y t (con o sin cafena).  Bebidas que contengan alcohol.  Bebidas energizantes y deportivas.  Gaseosas o refrescos.  Chocolate y cacao.  Menta y esencias de [address].  Ajo y  cebollas.  Rbano picante.  Alimentos muy condimentados y cidos, entre ellos, pimientos, [place] en polvo, curry en polvo, vinagre, salsas picantes y [address] bar[address]oa.  Frutas ctricas y sus jugos, como naranjas, limones y limas.  Alimentos a base de tomates, como salsa roja, [place], salsa y pizza con salsa roja.  Alimentos fritos y Lexicographergrasos, como rosquillas, papas fritas y aderezos con alto contenido de Holiday representativegrasa.  Carnes con alto contenido de Brimleygrasa, como hot dogs y cortes grasos de carnes rojas y blancas, por ejemplo, filetes de entrecot, salchicha, jamn y tocino.  Productos lcteos con alto contenido de Bolangrasa, como Riponleche entera, Beach Parkmantequilla y queso crema.  Haga comidas pequeas y frecuentes Freight forwarderdurante el da en lugar de comidas abundantes.  Evite beber mucho lquido con las comidas.  No coma durante las 2 o 3horas previas a la hora de Chester Centeracostarse.  No se acueste inmediatamente despus de comer.  No haga actividad fsica enseguida despus de comer. Instrucciones generales  Est atento a cualquier cambio en los sntomas.  Tome los medicamentos de venta libre y los recetados solamente como se lo haya indicado el mdico. No tome aspirina, ibuprofeno ni otros antiinflamatorios no esteroides (AINE), a menos que se lo haya indicado el mdico.  No consuma ningn producto que contenga tabaco, lo que incluye cigarrillos, tabaco de Theatre managermascar y Administrator, Civil Servicecigarrillos electrnicos. Si necesita ayuda para dejar de fumar, consulte al mdico.  Use ropas sueltas. No use prendas ajustadas alrededor de la cintura que ejerzan presin en el abdomen.  Levante (eleve) 6pulgadas (15centmetros) la cabecera de la cama.  Trate de reducir J. C. Penneyel nivel de estrs con actividades como el yoga o la meditacin. Si necesita ayuda para reducir J. C. Penneyel nivel de estrs, consulte al mdico.  Si tiene sobrepeso, Media planneradelgace hasta alcanzar un peso saludable. Hable con el mdico acerca de su peso ideal y pdale asesoramiento en cuanto a la dieta  que debe seguir para Aeronautical engineerpoder alcanzarlo.  Concurra a todas las visitas de control como se lo haya indicado el mdico. Esto es importante. SOLICITE ATENCIN MDICA SI:  Aparecen nuevos sntomas.  Baja de peso sin causa aparente.  Tiene dificultad para tragar o siente dolor al Darden Restaurantshacerlo.  Tiene sibilancias o tos persistente.  Los sntomas no mejoran con Scientist, research (medical)el tratamiento.  Tiene la voz ronca. SOLICITE ATENCIN MDICA DE Engelhard CorporationNMEDIATO SI:  Tiene dolor en los brazos, el cuello, los Harpers Ferrymaxilares, la dentadura o la espalda.  Berenice Primasranspira, se marea o tiene sensacin de desvanecimiento.  Siente falta de aire o Journalist, newspaperdolor en el pecho.  Vomita y el vmito es parecido a la sangre o a los granos de caf.  Se desmaya.  Las heces son sanguinolentas o de color negro.  No puede tragar, beber o comer.   Esta informacin no tiene Theme park managercomo fin reemplazar el consejo del mdico. Asegrese de hacerle al mdico cualquier pregunta que tenga.   Document Released: 06/15/2005 Document Revised: 05/27/2015 Elsevier Interactive Patient Education [employer]2016 Elsevier Inc.

## 2015-09-02 NOTE — Progress Notes (Signed)
   Subjective:    Patient ID: Ann Chase, female    DOB: 03/27/1951, 64 y.o.   MRN: [id number]  HPI  1.  Dry skin:  Eucerin Cream helped--sounds like using the waxier version in a jar.   Pt. Later shows me a white cloth with tiny black debris scattered on it--does not look like insects or insect parts.  States she collected it after scratching at her face about 15 days ago.  2.  Seborrhea:  Using Nizoral.  Flaking is improved.  Only notes with drying hair with a towel.  Never notes on shoulders.  3.  Dry tongue:  Still same problem.  Drinking 5 cup of water daily.  Sometimes drinks coffee with milk.  Is taking Balanced B100 Complex daily.  Does not feel the B complex has helped. No dysphagia.   4.  Dry eyes:  Started using Refresh Tears and that has helped as well.    5.  When eats a lot (generally eats fast as well), has acid reflux.  Brings in a box of OTC OMeprazole and wonders if she should be taking this.  States she has reflux symptoms 1-2 times weekly.  Gets chest discomfort with throat pain and vomiting as her reflux symptoms.  Has had for years.  No melena or hematochezia.     Review of Systems     Objective:   Physical Exam  HEENT:  No flaking noted on scalp, inside fold of upper outer ears are areas of abrasion, tiny scabs from what appears to be chronic scratching, some erythema of skin folds behind ears, but no flaking or fissuring.  PERRL, though pupils quite small, conjunctivae mildly injected with frequent blinking.  Mouth is moist, no fissuring of lips at corners. Neck: Supple, no adenopathy Skin:  No abnormality Abd:  Not examined as pt. Brought this up as leaving.       Assessment & Plan:  1.  Seborrhea and dry skin:  Continue Nizoral for face and scalp/ears  2.  Dry skin:  Continue Eucerin and stay away from hot water showers  3.  Dry eyes and mouth: continue eye lubricant drops.  Check ANA and Sjogren testing as well as A1C.  Not clear if this  is an organic problem.  4.  Occasional GERD symptoms:  Discussed eating more frequent smaller meals.

## 2015-09-03 LAB — SJOGREN'S SYNDROME ANTIBODS(SSA + SSB)
ENA SSA (RO) Ab: <0.2 AI (ref 0.0–0.9)
ENA SSB (LA) Ab: <0.2 AI (ref 0.0–0.9)

## 2015-09-03 LAB — HGB A1C W/O EAG: HEMOGLOBIN A1C: 6.1 % — AB (ref 4.8–5.6)

## 2015-09-03 LAB — ANA: Anti Nuclear Antibody(ANA): NEGATIVE

## 2015-10-14 ENCOUNTER — Other Ambulatory Visit: Payer: Self-pay

## 2015-10-14 DIAGNOSIS — Z1231 Encounter for screening mammogram for malignant neoplasm of breast: Secondary | ICD-10-CM

## 2015-10-21 ENCOUNTER — Telehealth: Payer: Self-pay | Admitting: Internal Medicine

## 2015-10-21 NOTE — Telephone Encounter (Signed)
Patient called to get update about the ENT referral that was ordered by Dr.Mulberry on her last visit 09/02/15. No referral was found in the records. Please review

## 2015-10-26 ENCOUNTER — Ambulatory Visit
Admission: RE | Admit: 2015-10-26 | Discharge: 2015-10-26 | Disposition: A | Discharge status: Home / Self Care | Payer: No Typology Code available for payment source | Source: Ambulatory Visit

## 2015-10-26 DIAGNOSIS — Z1231 Encounter for screening mammogram for malignant neoplasm of breast: Secondary | ICD-10-CM

## 2015-10-28 NOTE — Telephone Encounter (Signed)
Patient states she was suppose to be referred to ENT specialist to see why she is having problems with her tongue (feels like she has some little insects), throat an ears.

## 2015-12-03 ENCOUNTER — Encounter: Payer: Self-pay | Admitting: Internal Medicine

## 2015-12-03 ENCOUNTER — Ambulatory Visit (INDEPENDENT_AMBULATORY_CARE_PROVIDER_SITE_OTHER): Payer: Self-pay | Admitting: Internal Medicine

## 2015-12-03 VITALS — BP 120/80 | HR 72 | Ht 61.0 in | Wt 138.0 lb

## 2015-12-03 DIAGNOSIS — K149 Disease of tongue, unspecified: Secondary | ICD-10-CM

## 2015-12-03 DIAGNOSIS — H04123 Dry eye syndrome of bilateral lacrimal glands: Secondary | ICD-10-CM

## 2015-12-03 DIAGNOSIS — R7303 Prediabetes: Secondary | ICD-10-CM

## 2015-12-03 NOTE — Progress Notes (Signed)
Subjective:    Patient ID: Ann Chase, female    DOB: [birth year], 65 y.o.   MRN: [id number]  HPI   1.  Tongue and oral complaints:    Pt. States we talked about going to an ENT at last visit--previous phone note regarding that.  I did not recall that discussion at our last visit 3 months ago.  She states her tongue sensations are not any better.  Is taking Vitamin B complex and does not feel it is helping.  Her labs for ANA, and for sicca syndrome (SSA and SSB) were negative at last visit as well. No itchy watery eyes, no definite itching of posterior pharynx or posterior pharyngeal drainage.  Has mainly sense of coating or like flannel on her tongue--cleans it all the time.  Also feels like flaking along her lips and also feels like a coating on her lips as well.  Has been taking the Vitamin B complex for some time without improvement.   No sense of dryness when trying to swallow food.     2.  Dry eyes:   Using hydrating eye drops 1 time daily and finds those help. States she has gone to an Sport and exercise psychologist at [employer] in past.  He did not mention dry eyes.  3.  Temporal scalp on the left feels like it is dry.    Denies that it itches, however.  Now states Nizoral shampoo does not seem to help.  Previously, felt it helped, but in recent weeks, not helping.  States she is having a lot of dandruff as well.   4.  Prediabetes:    A1C came back at 6.1% with last visit in evaluation of dry mouth.   Has not made any changes to her diet.   Has whole wheat toast or oatmeal with tea and milk or coffee and milk with a bit of sugar for breakfast. Salad with pico de gallo, avacado, cheese--stopped eating hamburger. Dinner:  Fish or chicken, salad, vegetable Tea or pan dulce once weekly--previously 5 times per week Used to eat a lot of ice cream, crackers, fried bread--but not anymore. Is walking 30 minutes daily.     Current outpatient prescriptions:  .  b complex vitamins tablet,  Take 1 tablet by mouth daily., Disp: , Rfl:  .  carboxymethylcellulose (REFRESH PLUS) 0.5 % SOLN, 1 drop daily as needed., Disp: , Rfl:  .  ketoconazole (NIZORAL) 2 % shampoo, Apply 1 application topically daily., Disp: , Rfl:  .  Calcium Carbonate-Vitamin D (CALCIUM 600+D) 600-400 MG-UNIT per tablet, Take 1 tablet by mouth 2 (two) times daily. Reported on 12/03/2015, Disp: , Rfl:  .  Multiple Vitamins-Minerals (MULTIVITAMIN WITH MINERALS) tablet, Take 1 tablet by mouth daily. Reported on 12/03/2015, Disp: , Rfl:    No Known Allergies      Review of Systems     Objective:   Physical Exam HEENT: Scalp without obvious dryness or flaking.  PERRL, EOMI, constantly blinking and conjunctivae mildly injected.  No watering.  Nasal mucosa normal, throat without injection.  Tongue appears normal, but patient constantly moving tongue against roof of mouth as if dry. Throat without injection.   TMs pearly gray Neck: Supple, no adenopathy Chest:  CTA CV:  RRR without murmur or rub        Assessment & Plan:  1.  Eye dryness:  Ophthalmology referral. Encouraged to use lubricating drops more frequently  2. Tongue and mouth complaints:  Will ask ENT to evaluate.  Unable to find etiology for her complaints.  3.  Prediabetes:  Sounds as if she has made significant changes to her diet and is trying to be more physically active.  Check A1C

## 2015-12-03 NOTE — Patient Instructions (Signed)
Habla clinica si no cita con [facility].

## 2015-12-04 LAB — HGB A1C W/O EAG: Hgb A1c MFr Bld: 6.2 % — ABNORMAL HIGH (ref 4.8–5.6)

## 2015-12-09 NOTE — Telephone Encounter (Signed)
Discussed at follow up

## 2016-01-06 ENCOUNTER — Telehealth: Payer: Self-pay | Admitting: Internal Medicine

## 2016-01-06 DIAGNOSIS — K149 Disease of tongue, unspecified: Secondary | ICD-10-CM

## 2016-01-06 DIAGNOSIS — R682 Dry mouth, unspecified: Secondary | ICD-10-CM

## 2016-01-06 NOTE — Telephone Encounter (Signed)
Patient called today today to inquire about ENT referral and was informed that we only get a certain amount of open slots per month and she will need to wait until we have one available for her. Patient is no willing to continue waiting. Patient would like to pay out of pocket and does not want to wait to do it through the [employer]. I will call Kennyth ArnoldStacy and talk to her about it. I will contact patient after [facility]

## 2016-01-07 NOTE — Telephone Encounter (Signed)
Spoke to Ann Chase today and states Ann Chase can refer patient to any ENT like she would do with other patients. Ann Chase would be able to schedule patient next month. Contacted patient and ask if she was willing to wait until next month and patient agreed to wait. Ann Chase is aware patient wants to do it through [employer]

## 2016-02-03 ENCOUNTER — Encounter: Payer: Self-pay | Admitting: Internal Medicine

## 2016-02-03 NOTE — Telephone Encounter (Signed)
Patient called again today to inquire if ENT appointment has been set up for her. Called Stacy to get update on referral and she states she sent over the referral information to [facility] but has not been contacted with an appointment for patient. It will probably be until next month. Patient is aware of it and is willing to pay as self pay and wants Dr.Mulberry to go ahead and refer patient to an ENT as soon as possible.

## 2016-02-04 NOTE — Telephone Encounter (Signed)
OK to send referral to [facility] --would find out what she would be expected to pay by calling there first.  Let me know and I will type up a referral note.

## 2016-02-11 NOTE — Telephone Encounter (Signed)
[facility] Patient will need to pay $200.00 for the initial visit. Payment plan available for the rest of the bill.  Next available appointment until the week of June 19th. Interpreter available for patient. Patient aware of fees and agrees to have referral sent to ENT.

## 2016-02-12 NOTE — Telephone Encounter (Signed)
ERROR

## 2016-02-19 ENCOUNTER — Telehealth: Payer: Self-pay | Admitting: Internal Medicine

## 2016-02-19 NOTE — Addendum Note (Signed)
Addended by: Marcene DuosMULBERRY, Nathanial Arrighi M on: 02/19/2016 03:17 PM   Modules accepted: Orders

## 2016-02-19 NOTE — Telephone Encounter (Signed)
[facility]erral [facility][phone]. They will contact patient to set up appointment.

## 2016-02-26 NOTE — Telephone Encounter (Signed)
Patient has appointment at [facility]- [address], [address], [place], [place] Telephone: [phone] on Tuesday, June 27th at 2:40 PM. Patient needs OC, co pay on OC, intepreter over the age of 18. Patient informed of appointment by Estefania. Appointment scheduled through the [employer] program ° °

## 2016-02-26 NOTE — Telephone Encounter (Signed)
Please document what ultimately her ENT follow up is--sounds like finally receive appt. [facility] in [facility].

## 2016-02-26 NOTE — Telephone Encounter (Signed)
Patient has appointment at [facility]- [address], [address], [place], [place] Telephone: [phone] on Tuesday, June 27th at 2:40 PM. Patient needs OC, co pay on OC, intepreter over the age of 218. Patient informed of appointment by Estefania. Appointment scheduled through the [employer] program

## 2016-03-03 ENCOUNTER — Ambulatory Visit (INDEPENDENT_AMBULATORY_CARE_PROVIDER_SITE_OTHER): Payer: Self-pay | Admitting: Internal Medicine

## 2016-03-03 ENCOUNTER — Encounter: Payer: Self-pay | Admitting: Internal Medicine

## 2016-03-03 VITALS — BP 122/70 | HR 70 | Resp 16 | Ht 60.25 in | Wt 138.0 lb

## 2016-03-03 DIAGNOSIS — R7303 Prediabetes: Secondary | ICD-10-CM

## 2016-03-03 LAB — GLUCOSE, POCT (MANUAL RESULT ENTRY): POC Glucose: 90 mg/dl (ref 70–99)

## 2016-03-03 NOTE — Progress Notes (Signed)
   Subjective:    Patient ID: Ann Chase, female    DOB: 07/17/1951, 65 y.o.   MRN: [id number]  HPI   1.  Prediabetes:  Discussed her A1C again today.  Breakfast:  Toast (white) with honey (a lot) and coffee--after much discussion regarding diet, clear she is still eating significant percentage of carbs.  2.  Tongue and mouth complaints: Pt. Has been treated repeatedly for oral thrush and evaluated for Sjogrens/Sicca syndrome with negative lab work.   At one point, treated for the possibility of schizophrenia with her symptom complaints felt to be hallucinations.  I have not definitively seen behavior or concerns to support this latter diagnosis, though intermittently, she complains of little insects under her skin and brings in material she has scraped from her skin she feels is insect related,though just looks like little pieces of debris. She is to be seen by ENT in near future to evaluate her oral area for something I may be missing.   Current outpatient prescriptions:  .  b complex vitamins tablet, Take 1 tablet by mouth daily., Disp: , Rfl:  .  Calcium Carbonate-Vitamin D (CALCIUM 600+D) 600-400 MG-UNIT per tablet, Take 1 tablet by mouth 2 (two) times daily. Reported on 12/03/2015, Disp: , Rfl:  .  carboxymethylcellulose (REFRESH PLUS) 0.5 % SOLN, 1 drop daily as needed., Disp: , Rfl:  .  ketoconazole (NIZORAL) 2 % shampoo, Apply 1 application topically daily., Disp: , Rfl:  .  Multiple Vitamins-Minerals (MULTIVITAMIN WITH MINERALS) tablet, Take 1 tablet by mouth daily. Reported on 12/03/2015, Disp: , Rfl:    No Known Allergies    Review of Systems     Objective:   Physical Exam  Blinking and oral noises as would see with dry eyes and mouth. Lungs: CTA CV:  RRR without murmur or rub, radial pulses normal and equal.      Assessment & Plan:  1.  Prediabetes:  Very long discussion regarding glycemic index and eating carbs associated with fiber.  Balanced diet  information given. Encouraged regular physical activity.  2.  Oral complaints:  ENT in near future.

## 2016-03-03 NOTE — Patient Instructions (Signed)

## 2016-04-07 ENCOUNTER — Ambulatory Visit (INDEPENDENT_AMBULATORY_CARE_PROVIDER_SITE_OTHER): Payer: Self-pay | Admitting: Internal Medicine

## 2016-04-07 ENCOUNTER — Encounter: Payer: Self-pay | Admitting: Internal Medicine

## 2016-04-07 VITALS — BP 110/60 | HR 68 | Temp 98.1°F | Resp 16 | Ht 60.5 in | Wt 138.0 lb

## 2016-04-07 DIAGNOSIS — M545 Low back pain, unspecified: Secondary | ICD-10-CM

## 2016-04-07 DIAGNOSIS — M25562 Pain in left knee: Secondary | ICD-10-CM

## 2016-04-07 DIAGNOSIS — R7303 Prediabetes: Secondary | ICD-10-CM

## 2016-04-07 LAB — GLUCOSE, POCT (MANUAL RESULT ENTRY): POC Glucose: 79 mg/dl (ref 70–99)

## 2016-04-07 MED ORDER — MELOXICAM 15 MG PO TABS: 15.0000 mg | ORAL_TABLET | Freq: Every day | ORAL | Status: DC

## 2016-04-07 NOTE — Progress Notes (Signed)
   Subjective:    Patient ID: Ann Chase, female    DOB: 10/19/1950, 65 y.o.   MRN: [id number]  HPI   1.  Posterior left knee pain:  Has had for 3 weeks.  Started with right low back pain.  Not clear if she thinks she was standing differently due to the back pain or why she relates the two.   No history of injury to the knee.  Does not recall overusing her knee or doing anything differently prior to the pain starting. Feels like something is stretching apart behind her knee when she stands from sitting.   When kneels, has pain, but worse when standing up from a kneeling position. Feels she has had swelling of then knee 2 weeks ago, lasting 1 week.   Has been taking a COX 1 and COX 2 inhibitor from [place], N[place] 100 mg once daily.  It helps a little bit.  2.  Right Low Back Pain:  First had problems 14 years ago.  Was a dishwasher at the time.  Not clear if bent over a sink or not with this job.  Has been having again for the past 3-4 months. 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   Did receive PT with improvement of her back pain. No radicular symptoms on the right.   Current outpatient prescriptions:  .  b complex vitamins tablet, Take 1 tablet by mouth daily., Disp: , Rfl:  .  Calcium Carbonate-Vitamin D (CALCIUM 600+D) 600-400 MG-UNIT per tablet, Take 1 tablet by mouth 2 (two) times daily. Reported on 12/03/2015, Disp: , Rfl:  .  carboxymethylcellulose (REFRESH PLUS) 0.5 % SOLN, 1 drop daily as needed., Disp: , Rfl:  .  ketoconazole (NIZORAL) 2 % shampoo, Apply 1 application topically daily., Disp: , Rfl:  .  Multiple Vitamins-Minerals (MULTIVITAMIN WITH MINERALS) tablet, Take 1 tablet by mouth daily. Reported on 12/03/2015, Disp: , Rfl:    No Known Allergies        Review of Systems     Objective:   Physical Exam NAD Walks to exam table without difficulty and steps up. Lungs:  CTA CV:  RRR without murmur or rub NT over lumbar spinous processes.  Point tenderness much more lateral on the right.  Good range of motion without obvious discomfort.  Negative straight leg raise. 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  No effusion or swelling, no erythema. Full ROM, with increased pain on full flexion.  NT over joint line bilaterally, but tender over mild fullness in posterior lateral popliteal fossa.  No pain or laxity with stress maneuvers of cruciates and collateral ligaments.       Assessment & Plan:  1.  Right Low Back Pain with some findings on MRI in Lumbar spine previously.  Not clear this is not just muscular.  PT referral.  Start Meloxicam 15 mg daily with food and stop the medication she obtained from friend.  2.  Left knee pain:  Xray and Meloxicam.  Recommend walking laps in pool, 3 ft. Has CPE in October and will see her in follow up then

## 2016-04-08 ENCOUNTER — Encounter: Payer: Self-pay | Admitting: Podiatry

## 2016-04-08 ENCOUNTER — Ambulatory Visit (INDEPENDENT_AMBULATORY_CARE_PROVIDER_SITE_OTHER): Payer: Self-pay | Admitting: Podiatry

## 2016-04-08 VITALS — BP 102/88 | HR 69 | Resp 12

## 2016-04-08 DIAGNOSIS — L84 Corns and callosities: Secondary | ICD-10-CM

## 2016-04-08 DIAGNOSIS — B351 Tinea unguium: Secondary | ICD-10-CM

## 2016-04-08 MED ORDER — NONFORMULARY OR COMPOUNDED ITEM: Status: DC

## 2016-04-08 NOTE — Patient Instructions (Signed)
Onychomycosis/Fungal Toenails  WHAT IS IT? An infection that lies within the keratin of your nail plate that is caused by a fungus.  WHY ME? Fungal infections affect all ages, sexes, races, and creeds.  There may be many factors that predispose you to a fungal infection such as age, coexisting medical conditions such as diabetes, or an autoimmune disease; stress, medications, fatigue, genetics, etc.  Bottom line: fungus thrives in a warm, moist environment and your shoes offer such a location.  IS IT CONTAGIOUS? Theoretically, yes.  You do not want to share shoes, nail clippers or files with someone who has fungal toenails.  Walking around barefoot in the same room or sleeping in the same bed is unlikely to transfer the organism.  It is important to realize, however, that fungus can spread easily from one nail to the next on the same foot.  HOW DO WE TREAT THIS?  There are several ways to treat this condition.  Treatment may depend on many factors such as age, medications, pregnancy, liver and kidney conditions, etc.  It is best to ask your doctor which options are available to you.   No treatment.   Unlike many other medical concerns, you can live with this condition.  However for many people this can be a painful condition and may lead to ingrown toenails or a bacterial infection.  It is recommended that you keep the nails cut short to help reduce the amount of fungal nail.  Topical treatment.  These range from herbal remedies to prescription strength nail lacquers.  About 40-50% effective, topicals require twice daily application for approximately 9 to 12 months or until an entirely new nail has grown out.  The most effective topicals are medical grade medications available through physicians offices.  Oral antifungal medications.  With an 80-90% cure rate, the most common oral medication requires 3 to 4 months of therapy and stays in your system for a year as the new nail grows out.  Oral  antifungal medications do require blood work to make sure it is a safe drug for you.  A liver function panel will be performed prior to starting the medication and after the first month of treatment.  It is important to have the blood work performed to avoid any harmful side effects.  In general, this medication safe but blood work is required.  Laser Therapy.  This treatment is performed by applying a specialized laser to the affected nail plate.  This therapy is noninvasive, fast, and non-painful.  It is not covered by insurance and is therefore, out of pocket.  The results have been very good with a 80-95% cure rate.  [facility] is the only practice in the area to offer this therapy. Permanent Nail Avulsion.  Removing the entire nail so that a new nail will not grow back.   Corns and Calluses Corns are small areas of thickened skin that occur on the top, sides, or tip of a toe. They contain a cone-shaped core with a point that can press on a nerve below. This causes pain. Calluses are areas of thickened skin that can occur anywhere on the body including hands, fingers, palms, soles of the feet, and heels.Calluses are usually larger than corns.  CAUSES  Corns and calluses are caused by rubbing (friction) or pressure, such as from shoes that are too tight or do not fit properly.  RISK FACTORS Corns are more likely to develop in people who have toe deformities, such  as hammer toes. Since calluses can occur with friction to any area of the skin, calluses are more likely to develop in people who:   Work with their hands.  Wear shoes that fit poorly, shoes that are too tight, or shoes that are high-heeled.  Have toes deformities. SYMPTOMS Symptoms of a corn or callus include:  A hard growth on the skin.   Pain or tenderness under the skin.   Redness and swelling.   Increased discomfort while wearing tight-fitting shoes. DIAGNOSIS  Corns and calluses may be diagnosed with a  medical history and physical exam.  TREATMENT  Corns and calluses may be treated with:  Removing the cause of the friction or pressure. This may include:  Changing your shoes.  Wearing shoe inserts (orthotics) or other protective layers in your shoes, such as a corn pad.  Wearing gloves.  Medicines to help soften skin in the hardened, thickened areas.  Reducing the size of the corn or callus by removing the dead layers of skin.  Antibiotic medicines to treat infection.  Surgery, if a toe deformity is the cause. HOME CARE INSTRUCTIONS   Take medicines only as directed by your health care provider.  If you were prescribed an antibiotic, finish all of it even if you start to feel better.  Wear shoes that fit well. Avoid wearing high-heeled shoes and shoes that are too tight or too loose.  Wear any padding, protective layers, gloves, or orthotics as directed by your health care provider.  Soak your hands or feet and then use a file or pumice stone to soften your corn or callus. Do this as directed by your health care provider.  Check your corn or callus every day for signs of infection. Watch for:  Redness, swelling, or pain.  Fluid, blood, or pus. SEEK MEDICAL CARE IF:   Your symptoms do not improve with treatment.  You have increased redness, swelling, or pain at the site of your corn or callus.  You have fluid, blood, or pus coming from your corn or callus.  You have new symptoms.   This information is not intended to replace advice given to you by your health care provider. Make sure you discuss any questions you have with your health care provider.   Document Released: 06/11/2004 Document Revised: 01/20/2015 Document Reviewed: 09/01/2014 Elsevier Interactive Patient Education [employer]2016 Elsevier Inc.

## 2016-04-17 NOTE — Progress Notes (Signed)
Subjective:     Patient ID: Ann Chase, female   DOB: 20-Sep-1950, 65 y.o.   MRN: [id number]  HPI 65 year old female persisted office concerns of calluses to the balls of both of her feet which have been ongoing for several months and are worse with pressure in shoe gear. She is tried over-the-counter callus removal without any relief. Just a states that her big toenail is thick and discolored. Denies any redness or drainage or any swelling to the toe nail or along the callus sites. No other treatment for the toenail. No other complaints at this time.  Review of Systems  All other systems reviewed and are negative.      Objective:   Physical Exam General: AAO x3, NAD  Dermatological: Bilateral second metatarsal hyperkeratotic lesions. Upon debridement no underlying ulceration, drainage or other signs of infection. Hallux toenail somewhat dystrophic, discolored, hypertrophic. No tenderness palpation of the nail isany redness or drainage. No other open lesions or pre-ulcerative lesions are identified at this time.  Vascular: Dorsalis Pedis artery and Posterior Tibial artery pedal pulses are 2/4 bilateral with immedate capillary fill time.  There is no pain with calf compression, swelling, warmth, erythema.   Neruologic: Grossly intact via light touch bilateral. Vibratory intact via tuning fork bilateral. Protective threshold with Semmes Wienstein monofilament intact to all pedal sites bilateral.  Musculoskeletal: Positive metatarsal heads plantarly with atrophy of the fat pad. No other areas of tenderness bilaterally. MMT 5/5.  Gait: Unassisted, Nonantalgic.      Assessment:     Onychodystrophy/ankle mycosis hallux toenail, some metatarsal hyperkeratotic lesions    Plan:     -Treatment options discussed including all alternatives, risks, and complications -Etiology of symptoms were discussed -Discussed toe nail avulsion however is not painful. Signs infection. Order  compound cream to help with onychomycosis. -Hyperkeratotic lesions debrided 2 without complications or bleeding. All points has were dispensed. Discussed reoccurrence. Also discussed shoe gear modifications but of late pads to help minimize the pressure to help prevent recurrence.  Ovid Curd, DPM

## 2016-06-30 ENCOUNTER — Encounter: Payer: Self-pay | Admitting: Internal Medicine

## 2016-06-30 ENCOUNTER — Ambulatory Visit (INDEPENDENT_AMBULATORY_CARE_PROVIDER_SITE_OTHER): Payer: No Typology Code available for payment source | Admitting: Internal Medicine

## 2016-06-30 VITALS — BP 134/82 | HR 74 | Ht 60.5 in | Wt 134.0 lb

## 2016-06-30 DIAGNOSIS — Z0000 Encounter for general adult medical examination without abnormal findings: Secondary | ICD-10-CM

## 2016-06-30 DIAGNOSIS — Z13220 Encounter for screening for lipoid disorders: Secondary | ICD-10-CM

## 2016-06-30 DIAGNOSIS — Z1211 Encounter for screening for malignant neoplasm of colon: Secondary | ICD-10-CM

## 2016-06-30 DIAGNOSIS — L218 Other seborrheic dermatitis: Secondary | ICD-10-CM

## 2016-06-30 DIAGNOSIS — R7303 Prediabetes: Secondary | ICD-10-CM

## 2016-06-30 DIAGNOSIS — Z79899 Other long term (current) drug therapy: Secondary | ICD-10-CM

## 2016-06-30 DIAGNOSIS — Z124 Encounter for screening for malignant neoplasm of cervix: Secondary | ICD-10-CM

## 2016-06-30 DIAGNOSIS — Z23 Encounter for immunization: Secondary | ICD-10-CM

## 2016-06-30 DIAGNOSIS — L210 Seborrhea capitis: Secondary | ICD-10-CM

## 2016-06-30 NOTE — Progress Notes (Signed)
Subjective:    Patient ID: Ann MonarchCristina Mancera-Rosales, female    DOB: 10/19/1950, 65 y.o.   MRN: [id number]  HPI CPE with pap  1.  Pap:  Last 6 years ago.  Always normal.  2.  Mammogram: Last Mammogram in May 2017. Normal  3.  Osteoprevention:  Drinks 1 cup milk daily, Has yogurt or cheese once or twice weekly. Takes calcium with Vitamin D twice daily and walks for 40 minutes 3 times weekly  4.  Guaiac Cards:  Has not performed since lived in [place].  5.  Colonoscopy:  Last checked 8 years ago.  [place], but cannot remember where.  States it was normal.  6.  Immunizations:  Has never had an influenza vaccine.  Refuses. "never gets a cold or the flu" Cannot say when last obtained Td.     Review of Systems  Constitutional: Negative for fatigue and fever.  HENT: Negative for dental problem, ear pain and hearing loss.   Eyes: Negative for pain and visual disturbance.       Wears bifocals [facility]  Cardiovascular: Negative for chest pain, palpitations and leg swelling.  Gastrointestinal: Positive for constipation. Negative for abdominal pain, blood in stool, diarrhea and nausea.       No melena Occasionally with constipation  Endocrine: Negative for polyuria.       Chronically dry mouth  Genitourinary: Negative for dysuria, hematuria and vaginal discharge.  Musculoskeletal: Positive for arthralgias.       Bilateral knee joints hurt--chronic  Skin:       Scalp itches and has lots of dandruff.  Even with Ketoconazole shampoo.  Neurological: Negative for weakness, numbness and headaches.  Hematological: Negative for adenopathy. Does not bruise/bleed easily.  Psychiatric/Behavioral: Negative for dysphoric mood and suicidal ideas. The patient is not nervous/anxious.        Objective:   Physical Exam  Constitutional: She is oriented to person, place, and time. She appears well-developed and well-nourished.  HENT:  Head: Normocephalic  and atraumatic.  Right Ear: External ear normal.  Left Ear: External ear normal.  Nose: Nose normal.  Mouth/Throat: Oropharynx is clear and moist.  Eyes: Conjunctivae and EOM are normal. Pupils are equal, round, and reactive to light.  Discs sharp bilaterally  Neck: Normal range of motion. Neck supple. No thyroid mass and no thyromegaly present.  Cardiovascular: Normal rate, regular rhythm, S1 normal, S2 normal and normal pulses.  Exam reveals friction rub. Exam reveals no S3 and no S4.   No murmur heard. Pulmonary/Chest: Effort normal and breath sounds normal. Right breast exhibits no inverted nipple, no mass, no nipple discharge, no skin change and no tenderness. Left breast exhibits no inverted nipple, no mass, no nipple discharge, no skin change and no tenderness.  Abdominal: Soft. Bowel sounds are normal. She exhibits no mass. There is no hepatosplenomegaly. There is no tenderness. No hernia.  Genitourinary: Pelvic exam was performed with patient supine.  Genitourinary Comments: External and internal vaginal canal atrophic, shiny, friable.   Cervix friable as well with scant tan-brown discharge from os.  No odor. No uterine or adnexal mass or tenderness. Rectal exam:  No mass, nontender with good sphincter tone.  Heme negative light brown stool.  Musculoskeletal: Normal range of motion.  Heberdon's nodes of most fingers at DIPs.  Lymphadenopathy:       Head (right side): No submental and no submandibular adenopathy present.       Head (left  side): No submental and no submandibular adenopathy present.    She has no cervical adenopathy.    She has no axillary adenopathy.       Right: No inguinal and no supraclavicular adenopathy present.       Left: No inguinal and no supraclavicular adenopathy present.  Neurological: She is alert and oriented to person, place, and time. She has normal strength and normal reflexes. No cranial nerve deficit or sensory deficit. Coordination and gait  normal.  Skin: Skin is warm and dry. No lesion and no rash noted.  I do not see the flaking in her scalp she describes.  Psychiatric: She has a normal mood and affect. Her speech is normal and behavior is normal.          Assessment & Plan:  1.  CPE with pap:  Pap sent.   Mammogram already done and normal BC, CMP, FLP Tdap Refused flu vaccination--does not get respiratory illness. Guaiac Cards x 3, to return in 2 weeks.  2.  Prediabetes:  List for Diabetic prevention group.  Check A1C  3.  Concern for scalp flaking not amenable to Ketoconazole shampoo.  Not clear she truly has a problem.  This seems to follow with her complaints regarding her tongue where have not been able to see an abnormality. Will ask Dermatology to take a look for a second opinion.

## 2016-07-01 LAB — COMPREHENSIVE METABOLIC PANEL
ALBUMIN: 4.9 g/dL — AB (ref 3.6–4.8)
ALK PHOS: 58 IU/L (ref 39–117)
ALT: 13 IU/L (ref 0–32)
AST: 20 IU/L (ref 0–40)
Albumin/Globulin Ratio: 2 (ref 1.2–2.2)
BUN / CREAT RATIO: 19 (ref 12–28)
BUN: 15 mg/dL (ref 8–27)
Bilirubin Total: 0.6 mg/dL (ref 0.0–1.2)
CO2: 22 mmol/L (ref 18–29)
CREATININE: 0.8 mg/dL (ref 0.57–1.00)
Calcium: 9.5 mg/dL (ref 8.7–10.3)
Chloride: 103 mmol/L (ref 96–106)
GFR calc Af Amer: 91 mL/min/{1.73_m2} (ref >59)
GFR calc non Af Amer: 79 mL/min/{1.73_m2} (ref >59)
GLUCOSE: 88 mg/dL (ref 65–99)
Globulin, Total: 2.4 g/dL (ref 1.5–4.5)
Potassium: 4.2 mmol/L (ref 3.5–5.2)
Sodium: 144 mmol/L (ref 134–144)
Total Protein: 7.3 g/dL (ref 6.0–8.5)

## 2016-07-01 LAB — CBC WITH DIFFERENTIAL/PLATELET
BASOS ABS: 0 10*3/uL (ref 0.0–0.2)
Basos: 0 %
EOS (ABSOLUTE): 0.2 10*3/uL (ref 0.0–0.4)
Eos: 2 %
HEMOGLOBIN: 13.3 g/dL (ref 11.1–15.9)
Hematocrit: 39.4 % (ref 34.0–46.6)
IMMATURE GRANS (ABS): 0 10*3/uL (ref 0.0–0.1)
Immature Granulocytes: 0 %
LYMPHS ABS: 2.1 10*3/uL (ref 0.7–3.1)
LYMPHS: 31 %
MCH: 29.6 pg (ref 26.6–33.0)
MCHC: 33.8 g/dL (ref 31.5–35.7)
MCV: 88 fL (ref 79–97)
MONOCYTES: 6 %
Monocytes Absolute: 0.4 10*3/uL (ref 0.1–0.9)
NEUTROS PCT: 61 %
Neutrophils Absolute: 4.2 10*3/uL (ref 1.4–7.0)
Platelets: 238 10*3/uL (ref 150–379)
RBC: 4.49 x10E6/uL (ref 3.77–5.28)
RDW: 13.3 % (ref 12.3–15.4)
WBC: 6.9 10*3/uL (ref 3.4–10.8)

## 2016-07-01 LAB — LIPID PANEL W/O CHOL/HDL RATIO
CHOLESTEROL TOTAL: 189 mg/dL (ref 100–199)
HDL: 64 mg/dL (ref >39)
LDL CALC: 101 mg/dL — AB (ref 0–99)
TRIGLYCERIDES: 120 mg/dL (ref 0–149)
VLDL CHOLESTEROL CAL: 24 mg/dL (ref 5–40)

## 2016-07-01 LAB — HGB A1C W/O EAG: Hgb A1c MFr Bld: 5.6 % (ref 4.8–5.6)

## 2016-07-12 ENCOUNTER — Other Ambulatory Visit: Payer: Self-pay | Admitting: Internal Medicine

## 2016-07-12 DIAGNOSIS — M545 Low back pain, unspecified: Secondary | ICD-10-CM

## 2016-07-12 DIAGNOSIS — M25562 Pain in left knee: Secondary | ICD-10-CM

## 2016-07-15 ENCOUNTER — Encounter: Payer: Self-pay | Admitting: Podiatry

## 2016-07-18 ENCOUNTER — Encounter: Payer: Self-pay | Admitting: Podiatry

## 2016-07-21 ENCOUNTER — Other Ambulatory Visit: Payer: Self-pay | Admitting: Internal Medicine

## 2016-07-21 DIAGNOSIS — Z1211 Encounter for screening for malignant neoplasm of colon: Secondary | ICD-10-CM

## 2016-07-21 LAB — POC HEMOCCULT BLD/STL (HOME/3-CARD/SCREEN)
Card #2 Fecal Occult Blod, POC: NEGATIVE
Card #3 Fecal Occult Blood, POC: NEGATIVE
Fecal Occult Blood, POC: NEGATIVE

## 2016-07-21 NOTE — Addendum Note (Signed)
Addended by: Marcene DuosMULBERRY, Gaylynn Seiple M on: 07/21/2016 06:21 PM   Modules accepted: Orders

## 2016-07-25 ENCOUNTER — Ambulatory Visit (INDEPENDENT_AMBULATORY_CARE_PROVIDER_SITE_OTHER): Payer: Self-pay | Admitting: Podiatry

## 2016-07-25 ENCOUNTER — Encounter: Payer: Self-pay | Admitting: Podiatry

## 2016-07-25 DIAGNOSIS — B351 Tinea unguium: Secondary | ICD-10-CM

## 2016-07-25 DIAGNOSIS — L84 Corns and callosities: Secondary | ICD-10-CM

## 2016-07-25 DIAGNOSIS — M79674 Pain in right toe(s): Secondary | ICD-10-CM

## 2016-07-25 DIAGNOSIS — M79675 Pain in left toe(s): Secondary | ICD-10-CM

## 2016-07-26 MED ORDER — NONFORMULARY OR COMPOUNDED ITEM: 2 refills | Status: DC

## 2016-07-26 NOTE — Addendum Note (Signed)
Addended by: Alphia Kava'CONNELL, VALERY D on: 07/26/2016 11:42 AM   Modules accepted: Orders

## 2016-07-26 NOTE — Progress Notes (Signed)
Subjective: 65 y.o. returns the office today for painful, elongated, thickened toenails which she cannot trim herself. Denies any redness or drainage around the nails. She also has calluses to the balls of her feet. Denies any acute changes since last appointment and no new complaints today. Denies any systemic complaints such as fevers, chills, nausea, vomiting.   Objective: AAO 3, NAD DP/PT pulses palpable, CRT less than 3 seconds Nails hypertrophic, dystrophic, elongated, brittle, discolored 10. There is tenderness overlying the nails 1-5 bilaterally. There is no surrounding erythema or drainage along the nail sites. Hyperkeratotic lesions present bilateral submetatarsal 2. Upon debridement no underlying ulceration, drainage or evidence of infection. No open lesions or pre-ulcerative lesions are identified. No other areas of tenderness bilateral lower extremities. No overlying edema, erythema, increased warmth. No pain with calf compression, swelling, warmth, erythema.  Assessment: Patient presents with symptomatic onychomycosis; hyperkeratotic lesions  Plan: -Treatment options including alternatives, risks, complications were discussed -Nails sharply debrided 2 without complication/bleeding. -Hyperkeratotic lesions debrided 2 without complications or bleeding. -Ordered compound cream for onychomycosis. [facility].  -Discussed daily foot inspection. If there are any changes, to call the office immediately.  -Follow-up in 3 months or sooner if any problems are to arise. In the meantime, encouraged to call the office with any questions, concerns, changes symptoms.  Ovid CurdMatthew Yenty Bloch, DPM

## 2016-10-28 ENCOUNTER — Telehealth: Payer: Self-pay | Admitting: Internal Medicine

## 2016-10-28 NOTE — Telephone Encounter (Signed)
Patient had an x-ray on her hip and has not gotten one on her knee. She is having pain in her hip and also in her knee when she stands for a prolonged period of time. She wants to make an

## 2016-12-09 ENCOUNTER — Ambulatory Visit: Payer: Self-pay | Admitting: Internal Medicine

## 2017-01-17 ENCOUNTER — Other Ambulatory Visit: Payer: Self-pay | Admitting: Obstetrics and Gynecology

## 2017-01-17 DIAGNOSIS — Z1231 Encounter for screening mammogram for malignant neoplasm of breast: Secondary | ICD-10-CM

## 2017-01-26 ENCOUNTER — Ambulatory Visit (INDEPENDENT_AMBULATORY_CARE_PROVIDER_SITE_OTHER): Payer: Self-pay | Admitting: Internal Medicine

## 2017-01-26 ENCOUNTER — Encounter: Payer: Self-pay | Admitting: Internal Medicine

## 2017-01-26 VITALS — BP 122/78 | HR 70 | Resp 12 | Ht 60.75 in | Wt 141.0 lb

## 2017-01-26 DIAGNOSIS — K149 Disease of tongue, unspecified: Secondary | ICD-10-CM

## 2017-01-26 DIAGNOSIS — M5416 Radiculopathy, lumbar region: Secondary | ICD-10-CM

## 2017-01-26 DIAGNOSIS — R7303 Prediabetes: Secondary | ICD-10-CM

## 2017-01-26 HISTORY — DX: Radiculopathy, lumbar region: M54.16

## 2017-01-26 NOTE — Progress Notes (Signed)
   Subjective:    Patient ID: Ann MonarchCristina Mancera-Rosales, female    DOB: 06/27/1951, 66 y.o.   MRN: [id number]  HPI   1.  Wants tongue biopsied.  Has been to ENT twice now with no concerning findings.  Discussed they would have biopsied her tongue if there was a concern.  No further recommended evaluation for this complaint discussed.  2.  Bilateral knee pain:  Had Xrays ordered after a visit 03/2016, but have not been able to get a radiologic slot for her.   3.  Right low back pain with radicular symptoms to anterior low thigh, just above knee.  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   Called and spoke with Dr[facility] Ernest[facility]iggins at [facility] who stated this cyst is about 1 cm in diameter and would recommend a surgeon rather than interventional radiologist to determine future treatment.  Current Meds  Medication Sig  . Calcium Carbonate-Vitamin D (CALCIUM 600+D) 600-400 MG-UNIT per tablet Take 1 tablet by mouth 2 (two) times daily. Reported on 12/03/2015  . carboxymethylcellulose (REFRESH PLUS) 0.5 % SOLN 1 drop daily as needed.  . Multiple Vitamins-Minerals (MULTIVITAMIN WITH MINERALS) tablet Take 1 tablet by mouth daily. Reported on 12/03/2015    No Known Allergies   Review of Systems     Objective:   Physical Exam   NAD HEENT: No lesion noted with tongue or oral mucosa. Neck:  Supple, No adenopathy Chest:  CTA CV:  RRR without murmur or rub, radial pulses normal and equal MS/back:  Moves easily with full ROM.  Mildly tender over right lumbar paraspinous musulature Neuro:  A & O x 3, CN II-XII intact DTRs 2+/4 throughout, sensory grossly normal.  Gait normal       Assessment & Plan:  1.  Chronic tongue complaints:  No findings with repeated evaluations with primary and ENT care.  No further evaluation for now.  2.  Right  lumbar radiculopathy with symptoms consistent with L3/L4 involvement as noted on MRI with synovial cyst impingement:  Referral to neurosurgery.   3.  Knee complaints:  Discussed we can go ahead with xrays and she can apply for financial assistance.  Unable to place order at time of visit as computer system down.  Written order given to patient

## 2017-01-27 LAB — HGB A1C W/O EAG: HEMOGLOBIN A1C: 5.7 % — AB (ref 4.8–5.6)

## 2017-02-07 ENCOUNTER — Ambulatory Visit (HOSPITAL_COMMUNITY)
Admission: RE | Admit: 2017-02-07 | Discharge: 2017-02-07 | Disposition: A | Discharge status: Home / Self Care | Payer: No Typology Code available for payment source | Source: Ambulatory Visit | Attending: Obstetrics and Gynecology | Admitting: Obstetrics and Gynecology

## 2017-02-07 ENCOUNTER — Ambulatory Visit
Admission: RE | Admit: 2017-02-07 | Discharge: 2017-02-07 | Disposition: A | Discharge status: Home / Self Care | Payer: No Typology Code available for payment source | Source: Ambulatory Visit | Attending: Obstetrics and Gynecology | Admitting: Obstetrics and Gynecology

## 2017-02-07 ENCOUNTER — Encounter (HOSPITAL_COMMUNITY): Payer: Self-pay

## 2017-02-07 VITALS — BP 140/82 | Temp 98.2°F | Ht 63.0 in | Wt 141.6 lb

## 2017-02-07 DIAGNOSIS — Z1239 Encounter for other screening for malignant neoplasm of breast: Secondary | ICD-10-CM

## 2017-02-07 DIAGNOSIS — Z1231 Encounter for screening mammogram for malignant neoplasm of breast: Secondary | ICD-10-CM

## 2017-02-07 HISTORY — DX: Unspecified osteoarthritis, unspecified site: M19.90

## 2017-02-07 NOTE — Progress Notes (Signed)
No complaints today.   Pap Smear: Pap smear not completed today. Last [facility] was [facility], [facility] and normal. Per patient has no history of an abnormal Pap smear. Last Pap smear is in EPIC but unable to view the result.  Physical exam: Breasts Breasts symmetrical. No skin abnormalities bilateral breasts. No nipple retraction bilateral breasts. No nipple discharge bilateral breasts. No lymphadenopathy. No lumps palpated bilateral breasts. No complaints of pain or tenderness on exam. [facility] screening mamm[facility]. Appointment scheduled for Tuesday, Feb 07, 2017 at 1110.        Pelvic/Bimanual No Pap smear completed today since last Pap smear was 06/30/2016. Pap smear not indicated per BCCCP guidelines.   Smoking History: Patient has never smoked.  Patient Navigation: Patient education provided. Access to services provided for patient through [facility] program. Spanish interpreter provided.   Colorectal Cancer Screening: Per patient had a colonoscopy completed 5 years ago. Patient stated she completed a FOBT (Stool Card) in November 2017. No complaints today.  Used Spanish interpreter [employer] Lindner from [facility].

## 2017-02-07 NOTE — Patient Instructions (Signed)
Explained breast self awareness with Ann Chase. Patient did not need a Pap smear today due to last Pap smear was 06/30/2016. Let her know BCCCP will cover Pap smears every 3 years unless has a history of abnormal Pap smears. [facility] for [facility] mamm[facility]. Appointment scheduled for Tuesday, Feb 07, 2017 at 1110. Let patient know the Breast Center will follow up with her within the next couple weeks with results of mammogram by letter or phone. Ann Chase verbalized understanding.  Charlot Gouin, Kathaleen Maserhristine Poll, RN 11:09 AM

## 2017-02-14 ENCOUNTER — Encounter (HOSPITAL_COMMUNITY): Payer: Self-pay | Admitting: *Deleted

## 2017-02-15 ENCOUNTER — Telehealth: Payer: Self-pay | Admitting: Internal Medicine

## 2017-02-15 NOTE — Telephone Encounter (Signed)
To Dr. Mulberry for further direction 

## 2017-02-15 NOTE — Telephone Encounter (Signed)
Patient is asking about her referral to Orthopedics for her hip pain. Patient states Dr. Delrae AlfredMulberry was going to refer her on last visit. If unable to do referral through [facility] program, patient is willing to pay out of pocket.   Unable to locate referral in system. Please advise

## 2017-03-14 ENCOUNTER — Encounter: Payer: Self-pay | Admitting: Internal Medicine

## 2017-03-14 NOTE — Progress Notes (Signed)
Needs referral to [facility]surgery [facility].  Computer system was down at time of visit.   Checking to see where referral is in process.

## 2017-03-24 NOTE — Progress Notes (Signed)
noted 

## 2017-04-25 NOTE — Telephone Encounter (Signed)
I referred her to neurosurgery.  Please make sure this has happened

## 2017-05-01 NOTE — Telephone Encounter (Signed)
Ne[facility] at [facility] no longer accepting patients with no insurance. Referral sent to ortho

## 2017-06-14 ENCOUNTER — Ambulatory Visit (INDEPENDENT_AMBULATORY_CARE_PROVIDER_SITE_OTHER): Payer: Self-pay | Admitting: Physician Assistant

## 2017-06-14 ENCOUNTER — Encounter: Payer: Self-pay | Admitting: Physician Assistant

## 2017-06-14 VITALS — BP 119/69 | HR 86 | Temp 98.7°F | Resp 17 | Ht 62.0 in | Wt 148.0 lb

## 2017-06-14 DIAGNOSIS — Z79899 Other long term (current) drug therapy: Secondary | ICD-10-CM

## 2017-06-14 DIAGNOSIS — L84 Corns and callosities: Secondary | ICD-10-CM

## 2017-06-14 DIAGNOSIS — B351 Tinea unguium: Secondary | ICD-10-CM

## 2017-06-14 DIAGNOSIS — M79672 Pain in left foot: Secondary | ICD-10-CM

## 2017-06-14 MED ORDER — TERBINAFINE HCL 250 MG PO TABS: 250.0000 mg | ORAL_TABLET | Freq: Every day | ORAL | 0 refills | Status: DC

## 2017-06-14 NOTE — Progress Notes (Signed)
Patient ID: Ann Chase, female     DOB: [birth year], 66 y.o.    MRN: [id number]  PCP: Julieanne Manson, MD  Chief Complaint  Patient presents with  . Foot Pain    left     Subjective:   This patient is new to this office and presents for evaluation of a corn on the bottom of the LEFT foot causing pain. Stratus translator utilized for this visit: Raquel [id number].  This patient has pain from a corm on the plantar surface of the LEFT forefoot. She was referred to podiatry, where the corn was pared and she was advised to use a corn pad. She stopped using the pads because they are uncomfortable in her shoe. She is frustrated that the corn persists and has not contacted the podiatrist again because she feels that she did not get the results she wanted. She relates that she also wanted her LEFT great toenail removed, but that the podiatrist told her "he didn't want to."  Review of Systems Foot pain. Callus/corn. Thickened and discolored toenails. No rash.  Prior to Admission medications   Medication Sig Start Date End Date Taking? Authorizing Provider  b complex vitamins tablet Take 1 tablet by mouth daily.   Yes [provider]  Calcium Carbonate-Vitamin D (CALCIUM 600+D) 600-400 MG-UNIT per tablet Take 1 tablet by mouth 2 (two) times daily. Reported on 12/03/2015   Yes [provider]  carboxymethylcellulose (REFRESH PLUS) 0.5 % SOLN 1 drop daily as needed.   Yes [provider]  meloxicam (MOBIC) 15 MG tablet TAKE ONE TABLET BY MOUTH ONCE DAILY 07/12/16  Yes Julieanne Manson, MD  Multiple Vitamins-Minerals (MULTIVITAMIN WITH MINERALS) tablet Take 1 tablet by mouth daily. Reported on 12/03/2015   Yes [provider]     No Known Allergies   Patient Active Problem List   Diagnosis Date Noted  . Lumbar radiculopathy, right 01/26/2017  . Prediabetes 12/03/2015  . 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 Itching 02/04/2013  . 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 Physically well but worried 06/10/2011  . Seasonal allergies 01/07/2011  . LEG EDEMA, BILATERAL 10/11/2010  . MIGRAINE HEADACHE 06/14/2010  . VERTIGO 06/14/2010  . OVERWEIGHT 01/20/2010  . ONYCHOMYCOSIS 01/04/2010  . GERD 01/04/2010  . BACK PAIN 01/04/2010  . OSTEOPOROSIS 01/04/2010     Family History  Problem Relation Age of Onset  . Cirrhosis Father   . Hepatitis Father        Unknown viral etiology  . Osteoporosis Sister   . Benign prostatic hyperplasia Brother   . Diabetes Mellitus II Son      Social History   Social History  . Marital status: Legally Separated    Spouse name: N/A  . Number of children: 3  . Years of education: 8   Occupational History  . Foam plate Architectural technologist  . Restaurant dishwasher and cleaner     Does not wear gloves when washing with harsh chemicals.   Social History Main Topics  . Smoking status: Never Smoker  . Smokeless tobacco: Never Used  . Alcohol use No  . Drug use: No  . Sexual activity: No   Other Topics Concern  . Not on file   Social History Narrative   Originally from [place]   Moved to [place] In 2001   One son lives near her   She lives alone in her own home.  No friends live nearby--her friendships are with her coworkers         Objective:  Physical Exam  Constitutional: She is oriented to person, place, and time. She appears well-developed and well-nourished. She is active and cooperative. No distress.  BP 119/69   Pulse 86   Temp 98.7 F (37.1 C) (Oral)   Resp 17   Ht  (1.575 m)   Wt 148 lb (67.1 kg)   SpO2 98%   BMI 27.07 kg/m    Eyes: Conjunctivae are normal.  Pulmonary/Chest: Effort normal.  Musculoskeletal:       Feet:  Neurological: She is alert and oriented to person, place, and time.  Psychiatric: She has a normal mood and affect. Her speech is normal and behavior is normal.    Corn  was pared with a 15 blade and tolerated well by the patient.         Assessment & Plan:   Problem List Items Addressed This Visit    ONYCHOMYCOSIS    Update hepatic function tests today and start terbinafine. Repeat ALT and AST in 4 weeks. Encouraged follow-up with podiatrist for continued care and possible debridement of the nails.      Relevant Medications   terbinafine (LAMISIL) 250 MG tablet    Other Visit Diagnoses    Foot pain, left    -  Primary   due to plantar corn   Corn or callus       Pared. Encouraged follow-up with her primary care provider and podiatrist. Advised that resolution will require repeated visits/treatments.   High risk medication use       Relevant Orders   ALT   AST   Comprehensive metabolic panel (Completed)       Return in about 4 weeks (around 07/12/2017) for repeat lab test.   Fernande Bras, PA-C [facility] at [facility]

## 2017-06-14 NOTE — Patient Instructions (Addendum)
Please follow-up with your foot specialist.      IF you received an x-ray today, you will receive an invoice from [facility]. Please contact [facility] at [phone] with questions or concerns regarding your invoice.   IF you received labwork today, you will receive an invoice from [facility]. Please contact [facility] at [phone] with questions or concerns regarding your invoice.   Our billing staff will not be able to assist you with questions regarding bills from these companies.  You will be contacted with the lab results as soon as they are available. The fastest way to get your results is to activate your My Chart account. Instructions are located on the last page of this paperwork. If you have not heard from [place] regarding the results in 2 weeks, please contact this office.      Callos y durezas (Corns and Calluses) Los callos son pequeas zonas de piel engrosada en la parte superior, los costados o la punta de los dedos de los pies. Presentan un centro con forma cnica, con un punto que puede hacer presin sobre un nervio que est debajo. Esto ocasiona dolor. Las durezas son zonas de piel engrosada que pueden aparecer en cualquier parte del cuerpo, entre ellas, las [address], los dedos de [address], las [place], las plantas de los pies y los talones.Por lo general, las durezas son ms grandes que los callos. CAUSAS Los callos y las durezas aparecen debido al roce (friccin) o a la presin, por ejemplo, por calzado muy ajustado o que no calza como corresponde. FACTORES DE RIESGO Los callos son ms probables en las personas que tienen una deformidad en los dedos de los pies, por ejemplo, dedos en [place]. Debido a que las durezas pueden surgir por la friccin en cualquier zona de la piel, son ms probables en las personas que tienen estas caractersticas:  Chiropodist con las manos.  Usan calzado con taco alto, muy ajustado o que no calza como corresponde.  Tienen  una deformidad en los dedos de los pies. SNTOMAS Dynegy sntomas de un callo o una dureza, se incluyen los siguientes:  Una protuberancia dura en la piel.  Sensibilidad o dolor a la palpacin debajo de la piel.  Enrojecimiento e hinchazn.  Ms Kriste Basque al usar calzado ajustado. DIAGNSTICO Los callos y las durezas se pueden diagnosticar mediante la historia clnica y un examen fsico. TRATAMIENTO Los callos y las durezas se pueden tratar de la siguiente manera:  Eliminacin de la causa de la friccin o de la presin. Esto puede incluir lo siguiente: ? Surveyor, minerals. ? Usar plantillas ortopdicas u otros elementos de proteccin en el calzado, como una almohadilla para durezas. ? Usar guantes.  Medicamentos para ayudar a Chief Executive Officer las zonas endurecidas y engrosadas.  Reduccin del tamao del callo o de la dureza mediante la extraccin de las capas muertas de piel.  Antibiticos para tratar una infeccin.  Ciruga, si la causa es una deformidad en los dedos de los pies. INSTRUCCIONES PARA EL CUIDADO EN EL HOGAR  Tome los medicamentos solamente como se lo haya indicado el mdico.  Si le recetaron un antibitico, asegrese de terminarlo aunque comience a sentirse mejor.  Use zapatos que calcen bien. Evite usar calzado con taco alto y calzado muy ajustado o muy flojo.  Use almohadillas, elementos de proteccin, guantes o plantillas ortopdicas como se lo haya indicado el mdico.  Ponga en remojo las manos o los pies y luego use una lima o [place]  piedra pmez para suavizar el callo o la dureza. Hgalo segn las indicaciones del mdico.  Controle el callo o la dureza todos los [phone] para detectar signos de infeccin. Est atento a lo siguiente: ? Dolor, hinchazn o enrojecimiento. ? Lquido, sangre o pus. SOLICITE ATENCIN MDICA SI:  Los sntomas no mejoran con Scientist, research (medical).  Tiene ms enrojecimiento, hinchazn o dolor en el lugar del callo o de la  dureza.  Le sale lquido, sangre o pus del callo o de la dureza.  Aparecen nuevos sntomas. Esta informacin no tiene Theme park manager el consejo del mdico. Asegrese de hacerle al mdico cualquier pregunta que tenga. Document Released: 06/14/2008 Document Revised: 01/20/2015 Document Reviewed: 09/01/2014 Elsevier Interactive Patient Education  2018 Elsevier Inc.  Infeccin por hongos en las uas (Fungal Nail Infection) La infeccin por hongos en las uas es una infeccin por hongos frecuente en las uas de los pies o de las manos. Este trastorno [employer] uas de los pies con ms frecuencia que las uas de las manos. Ms de [name] ua puede infectarse. Esta afeccin puede transmitirse de [place] persona a otra (es  contagiosa). CAUSAS La causa de esta afeccin es un hongo. Existen distintos tipos de hongos que pueden causar la infeccin. Estos hongos son ms frecuentes en las zonas hmedas y clidas. Si las manos o los pies entran en contacto con los hongos, se pueden introducir en una ruptura de las uas de las manos o de los pies y Games developer infeccin. FACTORES DE RIESGO Los siguientes factores pueden hacer que usted sea propenso a sufrir esta afeccin:  Ser varn.  Tener diabetes.  Ser [name] persona de edad avanzada.  Convivir con alguien que tiene hongos.  Caminar descalzo en zonas donde proliferan hongos, como duchas o vestuarios.  Tener mala circulacin.  Usar zapatos y calcetines que [employer].  Tener pie de atleta.  Tener una ua lastimada o antecedentes recientes de una ciruga de uas.  Tener psoriasis.  Debilitamiento del sistema de defensa del cuerpo (sistema inmunitario). SNTOMAS Los sntomas de esta afeccin incluyen lo siguiente:  Cyndia Diver plida sobre la ua.  Engrosamiento de la ua.  Una ua que se torna amarilla o [place].  Bordes de las uas rugosos o quebradizos.  Una ua que se cae.  Una ua que se ha desprendido del lecho  ungueal. DIAGNSTICO Esta afeccin se diagnostica mediante un examen fsico. El mdico podr tomar una muestra de la ua para examinarla y Engineer, manufacturing si tiene hongos. TRATAMIENTO Las infecciones leves no necesitan tratamiento. Si tiene [employer] uas, el tratamiento puede incluir lo siguiente:  Medicamentos antimicticos por va oral. Deber tomar los medicamentos durante algunas semanas o meses y no ver los resultados hasta despus de un largo [place]. Estos medicamentos pueden tener efectos secundarios. Consulte al [employer] a los que debe estar atento.  Cremas y esmaltes para uas antimicticos. Se pueden usar junto con los medicamentos antimicticos que se administran por va oral.  Tratamiento lser de las uas.  Ciruga para extirpar la ua. Esto puede ser [employer] casos ms graves de infecciones. El tratamiento es muy largo y la infeccin Metallurgist. INSTRUCCIONES PARA EL CUIDADO EN EL HOGAR Medicamentos  Tome o aplquese los medicamentos de venta libre y Science writer se lo haya indicado el mdico.  Consulte al mdico sobre el uso de pomadas mentoladas para las uas de [place]. Estilo de vida  No comparta elementos personales  como toallas o cortauas.  Crtese las uas con frecuencia.  Lvese y squese las manos y los pies todos [place].  Use calcetines absorbentes y cmbiese los calcetines con frecuencia.  Use un tipo de calzado que permita que el aire [place], como sandalias o zapatillas de lona. Deseche los zapatos viejos.  Use guantes de goma si est trabajando con sus manos en lugares mojados.  No camine descalzo en duchas o vestuarios.  No concurra a un saln de cosmtica de uas si no usan instrumentos limpios.  No use uas artificiales. Instrucciones generales  Concurra a todas las visitas de control como se lo haya indicado el mdico. Esto es importante.  Aplquese polvo antimictico en los pies  y en los zapatos. SOLICITE ATENCIN MDICA SI: La infeccin no mejora o si empeora despus de varios meses. Esta informacin no tiene Theme park manager el consejo del mdico. Asegrese de hacerle al mdico cualquier pregunta que tenga. Document Released: 06/15/2005 Document Revised: 12/28/2015 Document Reviewed: 03/09/2015 Elsevier Interactive Patient Education  [employer].

## 2017-06-14 NOTE — Progress Notes (Signed)
Subjective:    Patient ID: Ann Chase, female    DOB: 1951-06-16, 66 y.o.   MRN: [id number]  HPI Chief Complaint  Patient presents with  . Foot Pain    left    Patient presents as a new patient today for evaluation of left foot pain. A Spanish translator helped during the visit.  She states that about a year ago she began having pain on the sole of her left foot from a "blister or welt." She denied any precipitating injury or event to cause it. 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 Patient wears socks with her work shoes. She denies any numbness or tingling from the area.    She notes that she went to a podiatrist at [facility] and they debrided the area but the pain and welt has persisted. She was given some ointment and has used lemon at home on the site but it did not help. She will rarely take ibuprofen or Tylenol for pain relief, but it only helps a little.  Patient also reports onychomycosis of the left great toe.   Review of Systems As above.  Prior to Admission medications   Medication Sig Start Date End Date Taking? Authorizing Provider  b complex vitamins tablet Take 1 tablet by mouth daily.   Yes [provider]  Calcium Carbonate-Vitamin D (CALCIUM 600+D) 600-400 MG-UNIT per tablet Take 1 tablet by mouth 2 (two) times daily. Reported on 12/03/2015   Yes [provider]  carboxymethylcellulose (REFRESH PLUS) 0.5 % SOLN 1 drop daily as needed.   Yes [provider]  meloxicam (MOBIC) 15 MG tablet TAKE ONE TABLET BY MOUTH ONCE DAILY 07/12/16  Yes Julieanne Manson, MD  Multiple Vitamins-Minerals (MULTIVITAMIN WITH MINERALS) tablet Take 1 tablet by mouth daily. Reported on 12/03/2015   Yes [provider]   No Known Allergies Social History   Social History  . Marital status: Legally Separated    Spouse name: N/A  . Number of  children: 3  . Years of education: 8   Occupational History  . Foam plate Architectural technologist  . Restaurant dishwasher and cleaner     Does not wear gloves when washing with harsh chemicals.   Social History Main Topics  . Smoking status: Never Smoker  . Smokeless tobacco: Never Used  . Alcohol use No  . Drug use: No  . Sexual activity: No   Other Topics Concern  . Not on file   Social History Narrative   Originally from [place]   Moved to [place]. In 2001   One son lives near her   She lives alone in her own home.   No friends live nearby--her friendships are with her coworkers   Patient Active Problem List   Diagnosis Date Noted  . Lumbar radiculopathy, right 01/26/2017  . Prediabetes 12/03/2015  . 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 Seborrheic dermatitis of scalp 02/18/2013  . Itching 02/04/2013  . 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 Physically well but worried 06/10/2011  . Seasonal allergies 01/07/2011  . LEG EDEMA, BILATERAL 10/11/2010  . MIGRAINE HEADACHE 06/14/2010  . VERTIGO 06/14/2010  . OVERWEIGHT 01/20/2010  . ONYCHOMYCOSIS 01/04/2010  . GERD 01/04/2010  . BACK PAIN 01/04/2010  . OSTEOPOROSIS 01/04/2010       Objective:   Physical Exam  Constitutional: She is oriented to  person, place, and time. She appears well-developed and well-nourished.  HENT:  Head: Normocephalic and atraumatic.  Right Ear: External ear normal.  Left Ear: External ear normal.  Cardiovascular: Normal rate, regular rhythm, normal heart sounds and intact distal pulses.   Pulmonary/Chest: Effort normal and breath sounds normal. No respiratory distress. She has no wheezes. She has no rales. She exhibits no tenderness.  Neurological: She is alert and oriented to person, place, and time.  Skin: Skin is warm and dry.        Assessment & Plan:  1. Foot pain, left 2. ONYCHOMYCOSIS 3. Corn or callus - terbinafine (LAMISIL) 250 MG tablet; Take 1 tablet (250 mg total)  by mouth daily.  Dispense: 90 tablet; Refill: 0 - Shaved down corn on sole of left foot.  4. High risk medication use - ALT; Future - AST; Future - Comprehensive metabolic panel  Follow up in 4 weeks, sooner if needed.  Respectfully, Gala Romney PA-S 2019

## 2017-06-15 LAB — COMPREHENSIVE METABOLIC PANEL
ALK PHOS: 75 IU/L (ref 39–117)
ALT: 16 IU/L (ref 0–32)
AST: 26 IU/L (ref 0–40)
Albumin/Globulin Ratio: 1.7 (ref 1.2–2.2)
Albumin: 4.4 g/dL (ref 3.6–4.8)
BUN/Creatinine Ratio: 23 (ref 12–28)
BUN: 16 mg/dL (ref 8–27)
Bilirubin Total: 0.3 mg/dL (ref 0.0–1.2)
CO2: 20 mmol/L (ref 20–29)
CREATININE: 0.69 mg/dL (ref 0.57–1.00)
Calcium: 9.2 mg/dL (ref 8.7–10.3)
Chloride: 105 mmol/L (ref 96–106)
GFR calc Af Amer: 107 mL/min/{1.73_m2} (ref >59)
GFR, EST NON AFRICAN AMERICAN: 93 mL/min/{1.73_m2} (ref >59)
GLOBULIN, TOTAL: 2.6 g/dL (ref 1.5–4.5)
Glucose: 151 mg/dL — ABNORMAL HIGH (ref 65–99)
Potassium: 3.6 mmol/L (ref 3.5–5.2)
Sodium: 142 mmol/L (ref 134–144)
Total Protein: 7 g/dL (ref 6.0–8.5)

## 2017-06-15 NOTE — Assessment & Plan Note (Signed)
Update hepatic function tests today and start terbinafine. Repeat ALT and AST in 4 weeks. Encouraged follow-up with podiatrist for continued care and possible debridement of the nails.

## 2017-06-22 ENCOUNTER — Encounter: Payer: Self-pay | Admitting: *Deleted

## 2017-10-25 HISTORY — PX: ESOPHAGOGASTRODUODENOSCOPY: SHX1529

## 2017-12-01 ENCOUNTER — Ambulatory Visit: Payer: Self-pay | Admitting: Diagnostic Neuroimaging

## 2017-12-01 ENCOUNTER — Telehealth: Payer: Self-pay | Admitting: Diagnostic Neuroimaging

## 2017-12-01 ENCOUNTER — Encounter: Payer: Self-pay | Admitting: Diagnostic Neuroimaging

## 2017-12-01 DIAGNOSIS — R682 Dry mouth, unspecified: Secondary | ICD-10-CM

## 2017-12-01 MED ORDER — DULOXETINE HCL 30 MG PO CPEP: 30.0000 mg | ORAL_CAPSULE | Freq: Every day | ORAL | 12 refills | Status: DC

## 2017-12-01 MED ORDER — CLONAZEPAM 0.5 MG PO TABS: 0.5000 mg | ORAL_TABLET | Freq: Every day | ORAL | 5 refills | Status: DC

## 2017-12-01 NOTE — Telephone Encounter (Signed)
self pay order sent to [facility] they will reach out to the patient to schedule.  °

## 2017-12-01 NOTE — Progress Notes (Signed)
[facility]  PATIENT: Ann MonarchCristina Mancera-Rosales DOB: 01/03/1951  REFERRING CLINICIAN: Longeran HISTORY FROM: patient via interpreter REASON FOR VISIT: new consult   HISTORICAL  CHIEF COMPLAINT:  Chief Complaint  Patient presents with  . Burning mouth syndrome    rm 7, New Pt, interpreter- Melissa    HISTORY OF PRESENT ILLNESS:   67 year old female here for evaluation of abnormal sensation in her mouth and tongue.  5 years ago patient had abnormal sensation in her skin, noted some "shining things" stuck to her skin.  She was also have a itching sensation in her skin and dryness in her hair on the left side.  She was evaluated by PCP and dermatology initially.  At some point she developed abnormal sensation in her tongue and mouth she felt as though there was "dirt" or hair on her tongue.  She saw multiple doctors including PCP, ENT, dentist without specific diagnosis.  At some point she was seen by psychiatry for possible hallucinations and psychosis.  She was treated with Haldol in the past.  Patient continues to have some abnormal sensation in her mouth, sometimes burning sensation, sometimes sensation of dirt or sand.  She also feels that her tongue moves involuntarily and this causes her some distress.   REVIEW OF SYSTEMS: Full 14 system review of systems performed and negative with exception of: Swelling in legs rash constipation joint pain depression anxiety memory loss shift work.   ALLERGIES: No Known Allergies  HOME MEDICATIONS: Outpatient Medications Prior to Visit  Medication Sig Dispense Refill  . Calcium Carbonate-Vitamin D (CALCIUM 600+D) 600-400 MG-UNIT per tablet Take 1 tablet by mouth 2 (two) times daily. Reported on 12/03/2015    . carboxymethylcellulose (REFRESH PLUS) 0.5 % SOLN 1 drop daily as needed.    . Multiple Vitamins-Minerals (MULTIVITAMIN WITH MINERALS) tablet Take 1 tablet by mouth daily. Reported on 12/03/2015    . pantoprazole  (PROTONIX) 40 MG tablet pantoprazole 40 mg tablet,delayed release  Take 1 tablet twice a day by oral route before meals.    . Polyethylene Glycol 3350 (MIRALAX PO) polyethylene glycol 3350 17 gram/dose oral powder  Take 17 g every day by oral route as needed.    Marland Kitchen. b complex vitamins tablet Take 1 tablet by mouth daily.    . meloxicam (MOBIC) 15 MG tablet TAKE ONE TABLET BY MOUTH ONCE DAILY (Patient not taking: Reported on 12/01/2017) 30 tablet 1  . terbinafine (LAMISIL) 250 MG tablet Take 1 tablet (250 mg total) by mouth daily. (Patient not taking: Reported on 12/01/2017) 90 tablet 0   No facility-administered medications prior to visit.     PAST MEDICAL HISTORY: Past Medical History:  Diagnosis Date  . Arthritis   . Delusions (HCC) 2013  . Glucose intolerance (impaired glucose tolerance) 2013   A1C as high as 6.0%.Weighed 20 lbs more at the time  . Lumbar radiculopathy, right 01/26/2017  . Osteoporosis   . Paranoia (psychosis) (HCC) 2013  . Tongue disorder    Not clear if an organic process    PAST SURGICAL HISTORY: Past Surgical History:  Procedure Laterality Date  . BREAST BIOPSY  7/15/005  . LIPOMA EXCISION N/A 1985    FAMILY HISTORY: Family History  Problem Relation Age of Onset  . Cirrhosis Father   . Hepatitis Father        Unknown viral etiology  . Osteoporosis Sister   . Benign prostatic hyperplasia Brother   . Diabetes Mellitus II Son     SOCIAL  HISTORY:  Social History   Socioeconomic History  . Marital status: Legally Separated    Spouse name: Not on file  . Number of children: 3  . Years of education: 8  . Highest education level: Not on file  Social Needs  . Financial resource strain: Not on file  . Food insecurity - worry: Not on file  . Food insecurity - inability: Not on file  . Transportation needs - medical: Not on file  . Transportation needs - non-medical: Not on file  Occupational History  . Occupation: Foam Teacher, English as a foreign language: [employer]    Comment: [employer]  . Occupation: Hospital doctor    Comment: part time- Does not wear gloves when washing with harsh chemicals.  Tobacco Use  . Smoking status: Never Smoker  . Smokeless tobacco: Never Used  Substance and Sexual Activity  . Alcohol use: No  . Drug use: No  . Sexual activity: No  Other Topics Concern  . Not on file  Social History Narrative   Originally from [place]   Moved to [place]. In 2001   One son lives near her   She lives alone in her own home.   No friends live nearby--her friendships are with her coworkers     PHYSICAL EXAM  GENERAL EXAM/CONSTITUTIONAL: Vitals:  Vitals:   12/01/17 0831  BP: 120/62  Pulse: 69  Weight: 147 lb 12.8 oz (67 kg)  Height: 5\' 3"  (1.6 m)     Body mass index is 26.18 kg/m.  Vision Screening Comments: Bifocals, could not complete exam  Patient is in no distress; well developed, nourished and groomed; neck is supple  CARDIOVASCULAR:  Examination of carotid arteries is normal; no carotid bruits  Regular rate and rhythm, no murmurs  Examination of peripheral vascular system by observation and palpation is normal  EYES:  Ophthalmoscopic exam of optic discs and posterior segments is normal; no papilledema or hemorrhages  MUSCULOSKELETAL:  Gait, strength, tone, movements noted in Neurologic exam below  NEUROLOGIC: MENTAL STATUS:  No flowsheet data found.  awake, alert, oriented to person, place and time  recent and remote memory intact  normal attention and concentration  language fluent, comprehension intact, naming intact,   fund of knowledge appropriate  CRANIAL NERVE:   2nd - no papilledema on fundoscopic exam  2nd, 3rd, 4th, 6th - pupils equal and reactive to light, visual fields full to confrontation, extraocular muscles intact, no nystagmus  5th - facial sensation symmetric  7th - facial strength symmetric  8th - hearing intact  9th - palate elevates  symmetrically, uvula midline  11th - shoulder shrug symmetric  12th - tongue protrusion midline  INTERMITTENT TONGUE AND MOUTH MOVEMENTS  MOTOR:   normal bulk and tone, full strength in the BUE, BLE  SENSORY:   normal and symmetric to light touch, temperature, vibration  COORDINATION:   finger-nose-finger, fine finger movements normal  REFLEXES:   deep tendon reflexes present and symmetric  GAIT/STATION:   narrow based gait    DIAGNOSTIC DATA (LABS, IMAGING, TESTING) - I reviewed patient records, labs, notes, testing and imaging myself where available.  Lab Results  Component Value Date   WBC 6.9 06/30/2016   HGB 13.3 06/30/2016   HCT 39.4 06/30/2016   MCV 88 06/30/2016   PLT 238 06/30/2016      Component Value Date/Time   NA 142 06/14/2017 1740   K 3.6 06/14/2017 1740   CL 105 06/14/2017 1740  CO2 20 06/14/2017 1740   GLUCOSE 151 (H) 06/14/2017 1740   GLUCOSE 98 01/21/2010 1836   BUN 16 06/14/2017 1740   CREATININE 0.69 06/14/2017 1740   CALCIUM 9.2 06/14/2017 1740   PROT 7.0 06/14/2017 1740   ALBUMIN 4.4 06/14/2017 1740   AST 26 06/14/2017 1740   ALT 16 06/14/2017 1740   ALKPHOS 75 06/14/2017 1740   BILITOT 0.3 06/14/2017 1740   GFRNONAA 93 06/14/2017 1740   GFRAA 107 06/14/2017 1740   Lab Results  Component Value Date   CHOL 189 06/30/2016   HDL 64 06/30/2016   LDLCALC 101 (H) 06/30/2016   TRIG 120 06/30/2016   CHOLHDL 3.7 Ratio 01/21/2010   Lab Results  Component Value Date   HGBA1C 5.7 (H) 01/26/2017   No results found for: [id number] Lab Results  Component Value Date   TSH 0.482 04/11/2012        ASSESSMENT AND PLAN  67 y.o. year old female here with abnormal sensation in mouth and tongue with abnormal involuntary movements of the tongue.  We will proceed with further workup.   Meds tried: duloxetine, gabapentin, amitriptyline  Ddx: dry mouth, tongue pathology, mucosal pathology, autoimmune, inflamm, metabolic, anxiety,  tardive dyskinesia  No diagnosis found.   PLAN:  ABNORMAL SENSATION IN TONGUE / MOUTH / ABNORMAL INVOLUNTARY TONGUE MOVEMENTS - check labs (ANA, SSA, SSB, B12, TSH, A1c) - check MRI brain (to rule out CNS etiologies) - restart biotene mouthwash rinse (3-5 times per day; patient was only using 1 small dose a few times per week) - restart duloxetine 30mg  daily x 2 weeks, then increase to 60mg ; continue long term - trial of low dose clonazepam 0.5mg  at bedtime  - consider referral to dentist / oral surgeon for dry mouth evaluation (they may be able to consider a biopsy if clinically indicated)  - consider referral to psychology for cognitive behavioral therapy (due to increase anxiety related to her mouth / tongue sensations)   Orders Placed This Encounter  Procedures  . MR BRAIN W WO CONTRAST  . Hemoglobin A1c  . Vitamin B12  . TSH  . ANA w/Reflex if Positive  . Sjogren's syndrome antibods(ssa + ssb)   Meds ordered this encounter  Medications  . DULoxetine (CYMBALTA) 30 MG capsule    Sig: Take 1 capsule (30 mg total) by mouth daily.    Dispense:  30 capsule    Refill:  12  . clonazePAM (KLONOPIN) 0.5 MG tablet    Sig: Take 1 tablet (0.5 mg total) by mouth at bedtime.    Dispense:  30 tablet    Refill:  5   Return in about 3 months (around 03/03/2018).    Suanne Marker, MD 12/01/2017, 9:00 AM Certified in Neurology, Neurophysiology and Neuroimaging  [facility] [address], [address] [place], [place] [address] [phone]

## 2017-12-01 NOTE — Patient Instructions (Signed)
ABNORMAL SENSATION IN TONGUE / MOUTH / ABNORMAL INVOLUNTARY TONGUE MOVEMENTS - check labs - check MRI brain - restart biotene mouthwash rinse (3-5 times per day) - restart duloxetine 30mg  daily x 2 weeks, then may increase to 60mg ; continue long term - trial of low dose clonazepam 0.5mg  at bedtime  - consider referral to dentist / oral surgeon for evaluation  - consider referral to psychology for anxiety related to her mouth / tongue sensations

## 2017-12-02 LAB — HEMOGLOBIN A1C
ESTIMATED AVERAGE GLUCOSE: 131 mg/dL
Hgb A1c MFr Bld: 6.2 % — ABNORMAL HIGH (ref 4.8–5.6)

## 2017-12-02 LAB — TSH: TSH: 1.27 u[IU]/mL (ref 0.450–4.500)

## 2017-12-02 LAB — SJOGREN'S SYNDROME ANTIBODS(SSA + SSB)

## 2017-12-02 LAB — VITAMIN B12: Vitamin B-12: 604 pg/mL (ref 232–1245)

## 2017-12-02 LAB — ANA W/REFLEX IF POSITIVE: Anti Nuclear Antibody(ANA): NEGATIVE

## 2017-12-05 ENCOUNTER — Telehealth: Payer: Self-pay | Admitting: *Deleted

## 2017-12-05 NOTE — Telephone Encounter (Signed)
I spoke thru [address] [employer], [place] Dragon[place]ipe [phone] and she relayed that the lab results were unremarkable except for slightly elevated A1C.  She had no questions and was doing ok.

## 2017-12-05 NOTE — Telephone Encounter (Signed)
-----   Message from Vikram R Penumalli, MD sent at 12/04/2017 10:40 AM EDT ----- Unremarkable labs except A1c still slightly elevated. Continue current plan. Please call patient. -VRP 

## 2017-12-05 NOTE — Telephone Encounter (Signed)
-----   Message from Suanne MarkerVikram R Penumalli, MD sent at 12/04/2017 10:40 AM EDT ----- Unremarkable labs except A1c still slightly elevated. Continue current plan. Please call patient. -VRP

## 2017-12-11 ENCOUNTER — Ambulatory Visit
Admission: RE | Admit: 2017-12-11 | Discharge: 2017-12-11 | Disposition: A | Discharge status: Home / Self Care | Payer: No Typology Code available for payment source | Source: Ambulatory Visit | Attending: Diagnostic Neuroimaging | Admitting: Diagnostic Neuroimaging

## 2017-12-11 DIAGNOSIS — R682 Dry mouth, unspecified: Secondary | ICD-10-CM

## 2017-12-11 MED ORDER — GADOBENATE DIMEGLUMINE 529 MG/ML IV SOLN
13.0000 mL | Freq: Once | INTRAVENOUS | Status: AC | PRN
  Administered 2017-12-11: 13 mL via INTRAVENOUS

## 2017-12-13 ENCOUNTER — Telehealth: Payer: Self-pay | Admitting: *Deleted

## 2017-12-13 NOTE — Telephone Encounter (Signed)
-----   Message from Vikram R Penumalli, MD sent at 12/12/2017  3:05 PM EDT ----- Unremarkable imaging results. No major findings. Please call patient. Continue current plan. -VRP 

## 2017-12-14 NOTE — Telephone Encounter (Signed)
Spoke thru [employer] Christiane HaJonathan [phone] and gave the results of MRI brain to pt.  Unremarkable, no major findings.  Continue current plan.  Pt is taking medications as ordered and she has not had any improvement as yet.  She will wait on referrals at this time.

## 2017-12-14 NOTE — Telephone Encounter (Signed)
-----   Message from Suanne MarkerVikram R Penumalli, MD sent at 12/12/2017  3:05 PM EDT ----- Unremarkable imaging results. No major findings. Please call patient. Continue current plan. -VRP

## 2018-03-14 ENCOUNTER — Telehealth: Payer: Self-pay | Admitting: *Deleted

## 2018-03-14 NOTE — Telephone Encounter (Signed)
Left VM with [facility] interpreting services requesting Spanish interpreter for patient's follow up on 03/19/18.

## 2018-03-19 ENCOUNTER — Ambulatory Visit: Payer: Self-pay | Admitting: Diagnostic Neuroimaging

## 2018-03-19 ENCOUNTER — Encounter: Payer: Self-pay | Admitting: Diagnostic Neuroimaging

## 2018-03-19 VITALS — BP 134/73 | HR 70 | Ht 63.0 in | Wt 150.8 lb

## 2018-03-19 DIAGNOSIS — Q383 Other congenital malformations of tongue: Secondary | ICD-10-CM

## 2018-03-19 DIAGNOSIS — R682 Dry mouth, unspecified: Secondary | ICD-10-CM

## 2018-03-19 NOTE — Patient Instructions (Addendum)
-   follow up with PCP and dentist / oral surgeon  - stop clonazepam  - stop duloxetine

## 2018-03-19 NOTE — Progress Notes (Signed)
[facility]  PATIENT: Ann Chase DOB: [birth year]  REFERRING CLINICIAN: Longeran HISTORY FROM: patient via interpreter REASON FOR VISIT: follow up   HISTORICAL  CHIEF COMPLAINT:  Chief Complaint  Patient presents with  . Dry mouth    rm 7, interpreter- Al[place]  . Follow-up    3 month    HISTORY OF PRESENT ILLNESS:   UPDATE (03/19/18, VRP): Since last visit, doing about the same. Tried duloxetine 30mg  daily, but never tried higher dose. May have tried clonazepam, but not sure. Memory is poor. Continues with sand sensation in the tongue, which seems to push the tongue up. No alleviating or aggravating factors. MRI brain and labs were unremarkable.   PRIOR HPI (12/01/17): 67 year old female here for evaluation of abnormal sensation in her mouth and tongue.  5 years ago patient had abnormal sensation in her skin, noted some "shining things" stuck to her skin.  She was also have a itching sensation in her skin and dryness in her hair on the left side.  She was evaluated by PCP and dermatology initially.  At some point she developed abnormal sensation in her tongue and mouth she felt as though there was "dirt" or hair on her tongue.  She saw multiple doctors including PCP, ENT, dentist without specific diagnosis.  At some point she was seen by psychiatry for possible hallucinations and psychosis.  She was treated with Haldol in the past.  Patient continues to have some abnormal sensation in her mouth, sometimes burning sensation, sometimes sensation of dirt or sand.  She also feels that her tongue moves involuntarily and this causes her some distress.   REVIEW OF SYSTEMS: Full 14 system review of systems performed and negative with exception ZO:[id number] loss blurred vision cramps diff walking. 4   ALLERGIES: No Known Allergies  HOME MEDICATIONS: Outpatient Medications Prior to Visit  Medication Sig Dispense Refill  . b complex vitamins tablet Take 1  tablet by mouth daily.    . Calcium Carbonate-Vitamin D (CALCIUM 600+D) 600-400 MG-UNIT per tablet Take 1 tablet by mouth 2 (two) times daily. Reported on 12/03/2015    . DULoxetine (CYMBALTA) 30 MG capsule Take 1 capsule (30 mg total) by mouth daily. 30 capsule 12  . Multiple Vitamins-Minerals (MULTIVITAMIN WITH MINERALS) tablet Take 1 tablet by mouth daily. Reported on 12/03/2015    . pantoprazole (PROTONIX) 40 MG tablet pantoprazole 40 mg tablet,delayed release  Take 1 tablet twice a day by oral route before meals.    . carboxymethylcellulose (REFRESH PLUS) 0.5 % SOLN 1 drop daily as needed.    . clonazePAM (KLONOPIN) 0.5 MG tablet Take 1 tablet (0.5 mg total) by mouth at bedtime. (Patient not taking: Reported on 03/19/2018) 30 tablet 5  . meloxicam (MOBIC) 15 MG tablet TAKE ONE TABLET BY MOUTH ONCE DAILY (Patient not taking: Reported on 12/01/2017) 30 tablet 1  . Polyethylene Glycol 3350 (MIRALAX PO) polyethylene glycol 3350 17 gram/dose oral powder  Take 17 g every day by oral route as needed.    . terbinafine (LAMISIL) 250 MG tablet Take 1 tablet (250 mg total) by mouth daily. (Patient not taking: Reported on 12/01/2017) 90 tablet 0   No facility-administered medications prior to visit.     PAST MEDICAL HISTORY: Past Medical History:  Diagnosis Date  . Arthritis   . Delusions (HCC) 2013  . Glucose intolerance (impaired glucose tolerance) 2013   A1C as high as 6.0%.Weighed 20 lbs more at the time  . Lumbar radiculopathy, right 01/26/2017  .  Osteoporosis   . Paranoia (psychosis) (HCC) 2013  . Tongue disorder    Not clear if an organic process    PAST SURGICAL HISTORY: Past Surgical History:  Procedure Laterality Date  . BREAST BIOPSY  7/15/005  . LIPOMA EXCISION N/A 1985    FAMILY HISTORY: Family History  Problem Relation Age of Onset  . Cirrhosis Father   . Hepatitis Father        Unknown viral etiology  . Osteoporosis Sister   . Benign prostatic hyperplasia Brother   .  Diabetes Mellitus II Son     SOCIAL HISTORY:  Social History   Socioeconomic History  . Marital status: Legally Separated    Spouse name: Not on file  . Number of children: 3  . Years of education: 8  . Highest education level: Not on file  Occupational History  . Occupation: Foam Chemical engineerplate manufacturing    Employer: [employer]    Comment: [employer]  . Occupation: Hospital doctorestaurant dishwasher and cleaner    Comment: part time- Does not wear gloves when washing with harsh chemicals.  Social Needs  . Financial resource strain: Not on file  . Food insecurity:    Worry: Not on file    Inability: Not on file  . Transportation needs:    Medical: Not on file    Non-medical: Not on file  Tobacco Use  . Smoking status: Never Smoker  . Smokeless tobacco: Never Used  Substance and Sexual Activity  . Alcohol use: No  . Drug use: No  . Sexual activity: Never  Lifestyle  . Physical activity:    Days per week: Not on file    Minutes per session: Not on file  . Stress: Not on file  Relationships  . Social connections:    Talks on phone: Not on file    Gets together: Not on file    Attends religious service: Not on file    Active member of club or organization: Not on file    Attends meetings of clubs or organizations: Not on file    Relationship status: Not on file  . Intimate partner violence:    Fear of current or ex partner: Not on file    Emotionally abused: Not on file    Physically abused: Not on file    Forced sexual activity: Not on file  Other Topics Concern  . Not on file  Social History Narrative   [place]   [place]   One son lives near her   She lives alone in her own home.   No friends live nearby--her friendships are with her coworkers     PHYSICAL EXAM  GENERAL EXAM/CONSTITUTIONAL: Vitals:  Vitals:   03/19/18 1537  BP: 134/73  Pulse: 70  Weight: 150 lb 12.8 oz (68.4 kg)  Height: 5\' 3"  (1.6 m)   Body mass index is 26.71 kg/m. No  exam data present  Patient is in no distress; well developed, nourished and groomed; neck is supple  CARDIOVASCULAR:  Examination of carotid arteries is normal; no carotid bruits  Regular rate and rhythm, no murmurs  Examination of peripheral vascular system by observation and palpation is normal  EYES:  Ophthalmoscopic exam of optic discs and posterior segments is normal; no papilledema or hemorrhages  MUSCULOSKELETAL:  Gait, strength, tone, movements noted in Neurologic exam below  NEUROLOGIC: MENTAL STATUS:  No flowsheet data found.  awake, alert, oriented to person, place and time  recent and remote  memory intact  normal attention and concentration  language fluent, comprehension intact, naming intact,   fund of knowledge appropriate  CRANIAL NERVE:   2nd - no papilledema on fundoscopic exam  2nd, 3rd, 4th, 6th - pupils equal and reactive to light, visual fields full to confrontation, extraocular muscles intact, no nystagmus  5th - facial sensation symmetric  7th - facial strength symmetric  8th - hearing intact  9th - palate elevates symmetrically, uvula midline  11th - shoulder shrug symmetric  12th - tongue protrusion midline  INTERMITTENT TONGUE AND MOUTH MOVEMENTS  MOTOR:   normal bulk and tone, full strength in the BUE, BLE  SENSORY:   normal and symmetric to light touch, temperature, vibration  COORDINATION:   finger-nose-finger, fine finger movements normal  REFLEXES:   deep tendon reflexes present and symmetric  GAIT/STATION:   narrow based gait    DIAGNOSTIC DATA (LABS, IMAGING, TESTING) - I reviewed patient records, labs, notes, testing and imaging myself where available.  Lab Results  Component Value Date   WBC 6.9 06/30/2016   HGB 13.3 06/30/2016   HCT 39.4 06/30/2016   MCV 88 06/30/2016   PLT 238 06/30/2016      Component Value Date/Time   NA 142 06/14/2017 1740   K 3.6 06/14/2017 1740   CL 105 06/14/2017  1740   CO2 20 06/14/2017 1740   GLUCOSE 151 (H) 06/14/2017 1740   GLUCOSE 98 01/21/2010 1836   BUN 16 06/14/2017 1740   CREATININE 0.69 06/14/2017 1740   CALCIUM 9.2 06/14/2017 1740   PROT 7.0 06/14/2017 1740   ALBUMIN 4.4 06/14/2017 1740   AST 26 06/14/2017 1740   ALT 16 06/14/2017 1740   ALKPHOS 75 06/14/2017 1740   BILITOT 0.3 06/14/2017 1740   GFRNONAA 93 06/14/2017 1740   GFRAA 107 06/14/2017 1740   Lab Results  Component Value Date   CHOL 189 06/30/2016   HDL 64 06/30/2016   LDLCALC 101 (H) 06/30/2016   TRIG 120 06/30/2016   CHOLHDL 3.7 Ratio 01/21/2010   Lab Results  Component Value Date   HGBA1C 6.2 (H) 12/01/2017   Lab Results  Component Value Date   VITAMINB12 604 12/01/2017   Lab Results  Component Value Date   TSH 1.270 12/01/2017    12/11/17 MRI brain [I reviewed images myself and agree with interpretation. -VRP]  1. There are some T2/FLAIR hyperintense foci in the subcortical white matter bilaterally with the largest focus in the subcortical right frontal operculum.   These foci are most consistent with chronic microvascular ischemic change.   Sequela of migraine or inflammation might also give this appearance. 2. Hypoplastic maxillary sinuses.  Minimal maxillary mucoperiosteal thickening consistent with minimal chronic maxillary sinusitis.     ASSESSMENT AND PLAN  67 y.o. year old female here with abnormal sensation in mouth and tongue with abnormal involuntary movements of the tongue.  Workup negative from neurology standpoint.    Meds tried: duloxetine, gabapentin, amitriptyline  Ddx: dry mouth, tongue pathology, mucosal pathology, autoimmune, inflamm, metabolic, anxiety, tardive dyskinesia  1. Abnormality of tongue   2. Dry mouth      PLAN:  ABNORMAL SENSATION IN TONGUE / MOUTH / ABNORMAL INVOLUNTARY TONGUE MOVEMENTS - stop duloxetine and clonazepam  - consider referral to dentist / oral surgeon for dry mouth evaluation (they may be able  to consider a biopsy if clinically indicated)  - consider referral to psychology for cognitive behavioral therapy (due to increase anxiety related to her mouth /  tongue sensations)  Return if symptoms worsen or fail to improve, for return to PCP.    Suanne Marker, MD 03/19/2018, 3:50 PM Certified in Neurology, Neurophysiology and Neuroimaging  [facility] [address] [place], [place] [address] [phone]

## 2018-06-25 ENCOUNTER — Other Ambulatory Visit (HOSPITAL_COMMUNITY): Payer: Self-pay | Admitting: *Deleted

## 2018-06-25 DIAGNOSIS — Z1231 Encounter for screening mammogram for malignant neoplasm of breast: Secondary | ICD-10-CM

## 2018-06-27 ENCOUNTER — Ambulatory Visit: Payer: Self-pay | Admitting: Internal Medicine

## 2018-07-19 ENCOUNTER — Encounter: Payer: Self-pay | Admitting: Internal Medicine

## 2018-07-19 ENCOUNTER — Ambulatory Visit: Payer: Self-pay | Admitting: Internal Medicine

## 2018-07-19 VITALS — BP 122/82 | HR 74 | Resp 12 | Ht 60.75 in | Wt 149.0 lb

## 2018-07-19 DIAGNOSIS — K146 Glossodynia: Secondary | ICD-10-CM

## 2018-07-19 MED ORDER — PANTOPRAZOLE SODIUM 40 MG PO TBEC: DELAYED_RELEASE_TABLET | ORAL | 11 refills | Status: DC

## 2018-07-19 MED ORDER — PRAMIPEXOLE DIHYDROCHLORIDE 0.125 MG PO TABS: ORAL_TABLET | ORAL | 2 refills | Status: DC

## 2018-07-19 NOTE — Progress Notes (Signed)
   Subjective:    Patient ID: Ann Chase, female    DOB: [birth year], 67 y.o.   MRN: [id number]  HPI  Here for her tongue burning again.  Feels she is having discharge from her tongue as well.  Last visit was 5/10//2018 here and has been following with [facility], referred to Neurology without concerning findings.  Has been seen by ENT through our clinic in past without concerning findings.  Most recently, seen by Neurology with no findings:  SSA, SSB, ANA all negative again. Was treated with Clonazepam and Duloxetine again without improvement  It appears she was also on Gabapentin and Amitriptyline without improvement. Discussed would be happy to send her to dentist, but at this point, there is not another specialist to send her to.   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  She is here again instead of more recent primary care physician at [facility] as she is looking for someone to reevaluate, in particular, feels she needs a biopsy. Discussed with her we have already sent her to the specialist that would have biopsied had he seen something the need to do so.   She feels no one is taking her seriously.   GERD:  Is not taking protonix as she ran out of her refills.  She is having reflux symptoms again.  Had EGD in February 2019.  H. Pylori was negative.  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  She was switched to Protonix and apparently controlled her symptoms better than Omeprazole.  Current Meds  Medication Sig  . Calcium Carbonate-Vitamin D (CALCIUM 600+D) 600-400 MG-UNIT per tablet Take 1 tablet by mouth 2 (two) times daily. Reported on 12/03/2015  . carboxymethylcellulose (REFRESH PLUS) 0.5 % SOLN 1 drop daily as needed.  . Multiple Vitamins-Minerals (MULTIVITAMIN WITH MINERALS) tablet Take 1 tablet by mouth daily. Reported on 12/03/2015    No Known Allergies Review of Systems     Objective:   Physical Exam  NAD HEENT:  PERRL, EOMI, throat  without injection.  No oral lesions.  Tongue smooth and without lesion or erythema.  Sublingual area without lesion.  No significant dental disease.   Neck: Supple, No adenopathy Chest:  CTA CV:  RRR without rub.  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   In another week, increase to 3 times daily. Call if any problems Follow up in 3 months

## 2018-07-19 NOTE — Patient Instructions (Signed)
Pramipexole 0.125 mg pastillas:  Una pastilla cada noche para 1 semana.  Tome una pastilla in la [place] y en la noche para una semana mas. semana 3:  Tome 1 pastilla tres veces al dia:  En la manana, 3 p.m  (mas o menos) y en la noche Si tiene problema con Pramipexole, habla clinica

## 2018-09-06 ENCOUNTER — Ambulatory Visit: Payer: No Typology Code available for payment source

## 2018-09-06 ENCOUNTER — Other Ambulatory Visit (HOSPITAL_COMMUNITY): Payer: Self-pay | Admitting: *Deleted

## 2018-09-06 ENCOUNTER — Ambulatory Visit (HOSPITAL_COMMUNITY)
Admission: RE | Admit: 2018-09-06 | Discharge: 2018-09-06 | Disposition: A | Discharge status: Home / Self Care | Payer: No Typology Code available for payment source | Source: Ambulatory Visit | Attending: Obstetrics and Gynecology | Admitting: Obstetrics and Gynecology

## 2018-09-06 ENCOUNTER — Encounter (HOSPITAL_COMMUNITY): Payer: Self-pay

## 2018-09-06 VITALS — BP 148/82 | Wt 155.0 lb

## 2018-09-06 DIAGNOSIS — N6313 Unspecified lump in the right breast, lower outer quadrant: Secondary | ICD-10-CM

## 2018-09-06 DIAGNOSIS — Z1239 Encounter for other screening for malignant neoplasm of breast: Secondary | ICD-10-CM

## 2018-09-06 DIAGNOSIS — N6310 Unspecified lump in the right breast, unspecified quadrant: Secondary | ICD-10-CM

## 2018-09-06 HISTORY — DX: Gastro-esophageal reflux disease without esophagitis: K21.9

## 2018-09-06 NOTE — Progress Notes (Signed)
Complaints of a right breast lump x 10 years that has been worked up.  Pap Smear: Pap smear not completed today. Last Pap smear was 1.5 years ago and normal per patient. Per patient has no history of an abnormal Pap smear. No Pap smear results are in Epic.  Physical exam: Breasts Breasts symmetrical. No skin abnormalities bilateral breasts. No nipple retraction bilateral breasts. No nipple discharge bilateral breasts. No lymphadenopathy. No lumps palpated left breast. Palpated a pea sized lump within the right breast at 8 o'clock 6 cm from the nipple. No complaints of pain or tenderness on exam. [facility] and right breast ultrasound. Appointment scheduled for Friday, September 14, 2018 at 1420.        Pelvic/Bimanual No Pap smear completed today since last Pap smear was 1.5 years ago per patient. Pap smear not indicated per BCCCP guidelines.   Smoking History: Patient has never smoked.  Patient Navigation: Patient education provided. [facility] services [facility]. Spanish interpreter provided.   Colorectal Cancer Screening: Per patient had a colonoscopy completed 8 years ago. Patient completed a FIT Test 07/21/2016 that was negative. No complaints today. FIT Test given to patient to complete and return to BCCCP.  Breast and Cervical Cancer Risk Assessment: Patient has no family history of breast cancer, known genetic mutations, or radiation treatment to the chest before age 65. Patient has no history of cervical dysplasia, immunocompromised, or DES exposure in-utero.  Risk Assessment    Risk Scores      09/06/2018   Last edited by: Lynnell DikeHolland, Sabrina H, LPN   5-year risk: 0.9 %   Lifetime risk: 3.7 %         Used Spanish interpreter Natale LayErika McReynolds from [facility].

## 2018-09-06 NOTE — Patient Instructions (Signed)
Explained breast self awareness with Ann Chase. Patient did not need a Pap smear today due to last Pap smear was 1.5 years ago per patient. Let her know BCCCP will cover Pap smears every 3 years unless has a history of abnormal Pap smears. [facility] for a diagnostic mamm[facility] and right breast ultrasound. Appointment scheduled for Friday, September 14, 2018 at 1420. Patient aware of appointment and will be there. Silvestre MesiCristina Chase verbalized understanding.  Margery Szostak, Kathaleen Maserhristine Poll, RN 1:01 PM

## 2018-09-14 ENCOUNTER — Ambulatory Visit
Admission: RE | Admit: 2018-09-14 | Discharge: 2018-09-14 | Disposition: A | Discharge status: Home / Self Care | Payer: No Typology Code available for payment source | Source: Ambulatory Visit | Attending: Obstetrics and Gynecology | Admitting: Obstetrics and Gynecology

## 2018-09-14 DIAGNOSIS — N6310 Unspecified lump in the right breast, unspecified quadrant: Secondary | ICD-10-CM

## 2018-09-17 ENCOUNTER — Encounter (HOSPITAL_COMMUNITY): Payer: Self-pay | Admitting: *Deleted

## 2018-10-02 ENCOUNTER — Other Ambulatory Visit: Payer: Self-pay

## 2018-10-07 LAB — FECAL OCCULT BLOOD, IMMUNOCHEMICAL: FECAL OCCULT BLD: NEGATIVE

## 2018-10-17 ENCOUNTER — Encounter (HOSPITAL_COMMUNITY): Payer: Self-pay

## 2018-10-18 ENCOUNTER — Encounter: Payer: Self-pay | Admitting: Internal Medicine

## 2018-10-18 ENCOUNTER — Ambulatory Visit: Payer: Self-pay | Admitting: Internal Medicine

## 2018-10-18 VITALS — BP 132/88 | HR 76 | Resp 12 | Ht 60.75 in | Wt 152.0 lb

## 2018-10-18 DIAGNOSIS — K146 Glossodynia: Secondary | ICD-10-CM

## 2018-10-18 DIAGNOSIS — R7303 Prediabetes: Secondary | ICD-10-CM

## 2018-10-18 MED ORDER — PRAMIPEXOLE DIHYDROCHLORIDE 0.25 MG PO TABS: ORAL_TABLET | ORAL | 6 refills | Status: DC

## 2018-10-18 NOTE — Patient Instructions (Addendum)
Tome un vaso de agua antes de cada comida Tome un minimo de 6 a 8 vasos de agua diarios Coma tres veces al dia Coma una proteina y [name] grasa saludable con comida.  (huevos, pescado, pollo, pavo, y limite carnes rojas Coma 5 porciones diarias de legumbres.  Mezcle los colores Coma 2 porciones diarias de frutas con cascara cuando sea comestible Use platos pequeos Suelte su tenedor o cuchara despues de cada mordida hata que se mastique y se trague Come en la mesa con amigos o familiares por lo menos una vez al dia [place] la televisin y aparatos electrnicos durante la comida  Su objetivo debe ser perder una libra por semana  Estudios recientes indican que las personas quienes consumen todos de sus calorias durante 12 horas se bajan de pesocon Mas eficiencia.  Por ejemplo, si Usted come su primera comida a las 7:00 a.m., su comida final del dia se debe completar antes de las 7:00 p.m.  Para Pramipexole:    1.  0.125 mg pastillas:  Hoy:  1 pastilla en la manana, 1 pastilla en la tarde,  2 pastillas en la noche. En 3 dias:  2 pastillas en la manana, 1 pastilla in la tarde, 2 pastillas en la noche. En 3 mas dias:  2 pastillas 3 veces al dia.  2.  Proxima receta:  compra 0.25 mg pastillas a Karin Golden, [place] a circa de [employer].  Tome 1 pastilla 3 veces al dia.

## 2018-10-18 NOTE — Progress Notes (Signed)
    Subjective:    Patient ID: Ann Chase, female   DOB: 04-05-1951, 68 y.o.   MRN: [id number]   HPI   1.  ?Burning Mouth Syndrome:  In October, started Pramipexole 0.125 mg and titrated to 3 times daily over 2 week's time.  She has been taking this dose regularly since her visit 07/19/18 and feels this has done nothing to relieve her symptoms. Still with the same level of burning of her tongue. She still feels she is having some sort of exudate accumulate on her tongue.  She states she spits it out, but does not see anything.   She remains focused on going back to ENT and getting a biopsy of her tongue.  She feels the previous ENT she saw some time ago for this did not take her seriously or really adequately examine her. Please see last note from 07/19/18  2.  GERD:  Taking the Protonix and symptoms much improved.  3.  Prediabetes: would like A1C rechecked.  Up to 6.2% a little less than 1 year ago. Not clear how much in way of lifestyle changes she has made since March 2019. Discussed diet and physical activity again.  4.  Lot of saliva all the time.      Current Meds  Medication Sig  . b complex vitamins tablet Take 1 tablet by mouth daily.  . Calcium Carbonate-Vitamin D (CALCIUM 600+D) 600-400 MG-UNIT per tablet Take 1 tablet by mouth 2 (two) times daily. Reported on 12/03/2015  . carboxymethylcellulose (REFRESH PLUS) 0.5 % SOLN 1 drop daily as needed.  . Multiple Vitamins-Minerals (MULTIVITAMIN WITH MINERALS) tablet Take 1 tablet by mouth daily. Reported on 12/03/2015  . pantoprazole (PROTONIX) 40 MG tablet 1 tab by mouth in the morning 30 minutes before breakfast  . pramipexole (MIRAPEX) 0.125 MG tablet 1 tab by mouth at bedtime and increase to 3 times daily over 3 weeks.   No Known Allergies   Review of Systems    Objective:   BP 132/88 (BP Location: Left Arm, Patient Position: Sitting, Cuff Size: Normal)   Pulse 76   Resp 12   Ht 5' 0.75" (1.543 m)    Wt 152 lb (68.9 kg)   LMP  (LMP Unknown)   BMI 28.96 kg/m   Physical Exam   NAD HEENT:  PERRL, EOMI, TMs pearly gray, throat without injection.   Constantly moving mouth and tongue around, pursing lips.  No noted tongue lesion or exudate on tongue surface. Neck:  Supple, No adenopathy.   Chest:  CTA CV:  RRR without murmur or rub.  Radial and DP pulses normal and equal   Assessment & Plan   1.  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 I will refer again to ENT, but discussed with patient the specialist would likely not biopsy if nothing is noted on gross examination. Please refer to 07/19/18 note for previous evaluations and treatments. This is a very long standing complaint. Brings up always having a lot of saliva in her mouth constantly.    2.  Prediabetes:  Encouraged to get back to healthier diet and regular daily physical activity.  A1C today. Spent 45 minutes face to face with patient today, interpretation to Spanish included in visit.

## 2018-10-19 LAB — HGB A1C W/O EAG: Hgb A1c MFr Bld: 6 % — ABNORMAL HIGH (ref 4.8–5.6)

## 2019-02-07 ENCOUNTER — Encounter: Payer: Self-pay | Admitting: Internal Medicine

## 2019-04-26 ENCOUNTER — Ambulatory Visit (HOSPITAL_COMMUNITY)
Admission: EM | Admit: 2019-04-26 | Discharge: 2019-04-26 | Disposition: A | Discharge status: Home / Self Care | Payer: Worker's Compensation | Attending: Family Medicine | Admitting: Family Medicine

## 2019-04-26 ENCOUNTER — Other Ambulatory Visit: Payer: Self-pay

## 2019-04-26 ENCOUNTER — Encounter (HOSPITAL_COMMUNITY): Payer: Self-pay | Admitting: Emergency Medicine

## 2019-04-26 ENCOUNTER — Ambulatory Visit (INDEPENDENT_AMBULATORY_CARE_PROVIDER_SITE_OTHER): Payer: Worker's Compensation

## 2019-04-26 DIAGNOSIS — S62102A Fracture of unspecified carpal bone, left wrist, initial encounter for closed fracture: Secondary | ICD-10-CM | POA: Diagnosis not present

## 2019-04-26 DIAGNOSIS — M79602 Pain in left arm: Secondary | ICD-10-CM

## 2019-04-26 MED ORDER — HYDROCODONE-ACETAMINOPHEN 5-325 MG PO TABS: 1.0000 | ORAL_TABLET | Freq: Four times a day (QID) | ORAL | 0 refills | Status: DC | PRN

## 2019-04-26 NOTE — ED Triage Notes (Signed)
Pt with fall this am landing on left arm; deformity noted to left wrist

## 2019-04-26 NOTE — ED Notes (Signed)
Ortho tech aware of need for short arm splint 

## 2019-04-26 NOTE — ED Provider Notes (Signed)
[facility]    CSN: [id number] Arrival date & time: 04/26/19  1127     History   Chief [id number] Chief Complaint  Patient presents with   Fall   Arm Pain    HPI Ann Chase is a 68 y.o. female.   HPI  Larey SeatFell forward onto the left arm, now wrist is swollen and painful with soft tissue swelling and mild deformity. 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   Seborrheic dermatitis of scalp 02/18/2013   Itching 02/04/2013   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   Physically well but worried 06/10/2011   Seasonal allergies 01/07/2011   LEG EDEMA, BILATERAL 10/11/2010   MIGRAINE HEADACHE 06/14/2010   VERTIGO 06/14/2010   OVERWEIGHT 01/20/2010   ONYCHOMYCOSIS 01/04/2010   GERD 01/04/2010   BACK PAIN 01/04/2010   OSTEOPOROSIS 01/04/2010    Past Surgical History:  Procedure Laterality Date   BREAST BIOPSY Right 7/15/005   LIPOMA EXCISION N/A 1985    OB History    Gravida  3   Para      Term      Preterm      AB      Living  3     SAB      TAB      Ectopic      Multiple      Live Births  3            Home Medications    Prior to Admission medications   Medication Sig Start Date End Date Taking? Authorizing Provider  b complex vitamins tablet Take 1 tablet by mouth daily.    [provider]  Calcium  Carbonate-Vitamin D (CALCIUM 600+D) 600-400 MG-UNIT per tablet Take 1 tablet by mouth 2 (two) times daily. Reported on 12/03/2015    [provider]  carboxymethylcellulose (REFRESH PLUS) 0.5 % SOLN 1 drop daily as needed.    [provider]  Multiple Vitamins-Minerals (MULTIVITAMIN WITH MINERALS) tablet Take 1 tablet by mouth daily. Reported on 12/03/2015    [provider]  pantoprazole (PROTONIX) 40 MG tablet 1 tab by mouth in the morning 30 minutes before breakfast 07/19/18   Julieanne MansonMulberry, Elizabeth, MD  pramipexole (MIRAPEX) 0.25 MG tablet 1 tab by mouth 3 times daily 10/18/18   Julieanne MansonMulberry, Elizabeth, MD    Family History Family History  Problem Relation Age of Onset   Cirrhosis Father    Hepatitis Father        Unknown viral etiology   Osteoporosis Sister    Benign prostatic hyperplasia Brother    Diabetes Mellitus II Son     Social History Social History   Tobacco Use   Smoking status: Never Smoker   Smokeless tobacco: Never Used  Substance Use Topics   Alcohol use: No   Drug use: No     Allergies  Patient has no known allergies.   Review of Systems Review of Systems  Constitutional: Negative for chills and fever.  HENT: Negative for ear pain and sore throat.   Eyes: Negative for pain and visual disturbance.  Respiratory: Negative for cough and shortness of breath.   Cardiovascular: Negative for chest pain and palpitations.  Gastrointestinal: Negative for abdominal pain and vomiting.  Genitourinary: Negative for dysuria and hematuria.  Musculoskeletal: Positive for arthralgias. Negative for back pain.  Skin: Negative for color change and rash.  Neurological: Negative for seizures and syncope.  All other systems reviewed and are negative.    Physical Exam Triage Vital Signs ED Triage Vitals [04/26/19 1233]  Enc Vitals Group     BP (!) 147/69     Pulse Rate 66     Resp 18     Temp 97.9 F (36.6 C)     Temp Source Oral      SpO2 100 %     Weight      Height      Head Circumference      Peak Flow      Pain Score 8     Pain Loc      Pain Edu?      Excl. in [place]?    No data found.  Updated Vital Signs BP (!) 147/69 (BP Location: Right Arm)    Pulse 66    Temp 97.9 F (36.6 C) (Oral)    Resp 18    LMP  (LMP Unknown)    SpO2 100%      Physical Exam Constitutional:      General: She is not in acute distress.    Appearance: She is well-developed.  HENT:     Head: Normocephalic and atraumatic.  Eyes:     Conjunctiva/sclera: Conjunctivae normal.     Pupils: Pupils are equal, round, and reactive to light.  Neck:     Musculoskeletal: Normal range of motion.  Cardiovascular:     Rate and Rhythm: Normal rate.  Pulmonary:     Effort: Pulmonary effort is normal. No respiratory distress.  Abdominal:     General: There is no distension.     Palpations: Abdomen is soft.  Musculoskeletal: Normal range of motion.     Comments: Left wrist is swollen, hand puffy and discolored, distal sensation and cap refill is intact  Skin:    General: Skin is warm and dry.  Neurological:     Mental Status: She is alert.      UC Treatments / Results  Labs (all labs ordered are listed, but only abnormal results are displayed) Labs Reviewed - No data to display  EKG   Radiology Dg Wrist Complete Left  Result Date: 04/26/2019 CLINICAL DATA:  [place] today landing on LEFT arm, LEFT wrist pain, swelling, bruising at metacarpals and wrist EXAM: LEFT WRIST - COMPLETE 3+ VIEW COMPARISON:  None FINDINGS: Osseous demineralization. Joint spaces preserved. Nondisplaced ulnar styloid fracture. Impacted metaphyseal fracture of the distal LEFT radius with intra-articular extension at the distal radioulnar joint and the radiocarpal joint. Displaced fragments at the dorsal margin of the distal radius. Neutral tilt of distal radial articular surface. Surrounding soft tissue swelling. No additional fracture or dislocation. IMPRESSION:  Nondisplaced LEFT ulnar styloid fracture. Comminuted mildly displaced intra-articular metaphyseal fracture of the distal LEFT radius. Electronically Signed   By: Lavonia Dana M.D.   On: 04/26/2019 13:11    Procedures Procedures (including critical care time)  Medications Ordered in UC  Medications - No data to display  Initial Impression / Assessment and Plan / UC Course  I have reviewed the triage vital signs and the nursing notes.  Pertinent labs & imaging results that were available during my care of the patient were reviewed by me and considered in my medical decision making (see chart for details).     I called Dr. Roney Mansreighton with [facility].  He is on-call for hand fractures.  He reviewed the x-rays and gave me instructions regarding management.  He will see her next week. Final Clinical Impressions(s) / UC Diagnoses   Final diagnoses:  Left arm pain  Fracture of wrist, closed, left, initial encounter     Discharge Instructions     Ice, elevate to reduce pain and swelling Leave splint on Wear sling when you are up/walking Take tylenol for mild pain Take Tylenol with hydrocodone for severe pain Call Dr Hinda Glatterreighton's office today, he will see you next week Tues or Wed     ED Prescriptions    None     Controlled Substance Prescriptions [place] Controlled Substance Registry consulted? Not Applicable   Eustace MooreNelson, Marabeth Melland Sue, MD 04/26/19 1455

## 2019-04-26 NOTE — Discharge Instructions (Addendum)
Ice, elevate to reduce pain and swelling Leave splint on Wear sling when you are up/walking Take tylenol for mild pain Take Tylenol with hydrocodone for severe pain Call Dr Shelbie Ammons office today, he will see you next week Tues or Wed

## 2019-05-23 ENCOUNTER — Encounter: Payer: Self-pay | Admitting: Internal Medicine

## 2019-05-23 ENCOUNTER — Other Ambulatory Visit: Payer: Self-pay | Admitting: Internal Medicine

## 2019-07-19 ENCOUNTER — Telehealth: Payer: Self-pay | Admitting: Internal Medicine

## 2019-07-19 NOTE — Telephone Encounter (Signed)
Please notify patient a message has been left at the [facility] and will have to wait for a response and that may not be until Monday.

## 2019-07-19 NOTE — Telephone Encounter (Signed)
Teresa at the [facility] called back stating pt. Is able to obtain another refill but she will have to pay for it again. Also- that was pt's last refill. Cherice please send refills to [facility] so pt is able to go pick up her medicine.   Please route to nicky so she call patient to inform.  Thank you!

## 2019-07-19 NOTE — Telephone Encounter (Signed)
Pt. Called stating she was at the pharmacy this morning and picked up her last refill of pantoprazole (PROTONIX) 40 MG tablet but probably dropped it in the bus because she didn't have it with her when she got home. Pt is asking if Dr. Amil Amen can assist her sending another refill.  Please advise.Marland KitchenMarland Kitchen

## 2019-07-19 NOTE — Telephone Encounter (Signed)
Left message with the [facility] to see if what their protocol is for this situation

## 2019-07-19 NOTE — Telephone Encounter (Signed)
Pt informed

## 2019-07-22 MED ORDER — PANTOPRAZOLE SODIUM 40 MG PO TBEC: DELAYED_RELEASE_TABLET | ORAL | 11 refills | Status: DC

## 2019-07-22 NOTE — Telephone Encounter (Signed)
Patient informed. 

## 2019-07-22 NOTE — Telephone Encounter (Signed)
Please inform patient the [facility] will refill her medication a new Rx has been sent over, but patient will have to pay the $6 co-pay for the Rx again.  To Antony Madura to notify patient

## 2019-07-24 ENCOUNTER — Telehealth: Payer: Self-pay | Admitting: Internal Medicine

## 2019-07-24 ENCOUNTER — Other Ambulatory Visit: Payer: Self-pay

## 2019-07-24 MED ORDER — PANTOPRAZOLE SODIUM 40 MG PO TBEC: DELAYED_RELEASE_TABLET | ORAL | 0 refills | Status: DC

## 2019-07-24 NOTE — Telephone Encounter (Signed)
Rx sent to [facility] on [address]

## 2019-07-24 NOTE — Telephone Encounter (Signed)
Patient called stating was unable to obtained pantoprazole (PROTONIX) 40 MG tablet from [facility] due to [place] expired. Patient requesting medication to be called in at [facility].   Please advise.

## 2019-07-25 NOTE — Telephone Encounter (Signed)
lvm with information and to call back if has any additional question.

## 2019-08-01 ENCOUNTER — Other Ambulatory Visit: Payer: Self-pay

## 2019-08-01 ENCOUNTER — Ambulatory Visit: Payer: Self-pay | Admitting: Internal Medicine

## 2019-08-01 ENCOUNTER — Encounter: Payer: Self-pay | Admitting: Internal Medicine

## 2019-08-01 VITALS — BP 148/78 | HR 62 | Resp 12 | Ht 60.75 in | Wt 155.0 lb

## 2019-08-01 DIAGNOSIS — Z0000 Encounter for general adult medical examination without abnormal findings: Secondary | ICD-10-CM

## 2019-08-01 DIAGNOSIS — K146 Glossodynia: Secondary | ICD-10-CM

## 2019-08-01 DIAGNOSIS — M8580 Other specified disorders of bone density and structure, unspecified site: Secondary | ICD-10-CM | POA: Insufficient documentation

## 2019-08-01 DIAGNOSIS — R7303 Prediabetes: Secondary | ICD-10-CM

## 2019-08-01 DIAGNOSIS — E663 Overweight: Secondary | ICD-10-CM

## 2019-08-01 DIAGNOSIS — Z124 Encounter for screening for malignant neoplasm of cervix: Secondary | ICD-10-CM

## 2019-08-01 DIAGNOSIS — Z23 Encounter for immunization: Secondary | ICD-10-CM

## 2019-08-01 NOTE — Patient Instructions (Addendum)
Tome un vaso de agua antes de cada comida Tome un minimo de 6 a 8 vasos de agua diarios Coma tres veces al dia Coma una proteina y Ardelia Mems grasa saludable con comida.  (huevos, pescado, pollo, pavo, y limite carnes rojas Coma 5 porciones diarias de legumbres.  Mezcle los colores Coma 2 porciones diarias de frutas con cascara cuando sea comestible Use platos pequeos Suelte su tenedor o cuchara despues de cada mordida hata que se mastique y se trague Come en la mesa con amigos o familiares por lo menos una vez al dia [place] la televisin y aparatos electrnicos durante la comida  Su objetivo debe ser perder una libra por semana  Estudios recientes indican que las personas quienes consumen todos de sus calorias durante 12 horas se bajan de pesocon Mas eficiencia.  Por ejemplo, si Usted come su primera comida a las 7:00 a.m., su comida final del dia se debe completar antes de las 7:00 p.m.  May wean Pramipexole by 1 tab weekly over the next 3 weeks until off

## 2019-08-01 NOTE — Progress Notes (Signed)
Subjective:    Patient ID: Ann Chase, female   DOB: 09/22/1950, 68 y.o.   MRN: [id number]   HPI   CPE with pap  1.  Pap:  Had last pap in 06/2016 and believe it was normal.  For some reason, never resulted to her results tab and currently unable to see where may have been scanned in.  She is fine with doing one more pap and stopping thereafter if normal.  All of her paps have been normal in the past.    2.  Mammogram:  Last in 08/2018 and normal.  Ultrasound as well for examination of patient's concern for right breast mass.  No family history of breast cancer.  3.  Osteoprevention:  Drinks 16 ounces of milk daily.  Willing to add 8 more ounces.  Is physically active daily--describes floor exercises.  Discussed importance of weight bearing exercise, such as walking.  History of osteopenia noted in 2008.   4.  Guaiac Cards:  Last performed in January of 2020 and negative.   5.  Colonoscopy:  Had a colonoscopy about 10 years ago.  States was normal.  Cannot remember where done.  No family history of colon cancer.   6.  Immunizations:   Immunization History  Administered Date(s) Administered  . Tdap 06/30/2016     7.  Glucose/Cholesterol:  History of good cholesterol panel with high HDL in 2018 and with prediabetes.  Last A1C was 6.0% in January. Discussed has gained 16 lbs in past 2 years.   She has not been working for 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  Lipid Panel     Component Value Date/Time   CHOL 189 06/30/2016 1632   TRIG 120 06/30/2016 1632   HDL 64 06/30/2016 1632   CHOLHDL 3.7 Ratio 01/21/2010 1836   VLDL 28 01/21/2010 1836   LDLCALC 101 (H) 06/30/2016 1632   LABVLDL 24 06/30/2016 1632     Current Meds  Medication Sig  . b complex vitamins tablet Take 1 tablet by mouth daily.  . Calcium Carbonate-Vitamin D (CALCIUM 600+D) 600-400  MG-UNIT per tablet Take 1 tablet by mouth 2 (two) times daily. Reported on 12/03/2015  . carboxymethylcellulose (REFRESH PLUS) 0.5 % SOLN 1 drop daily as needed.  . Multiple Vitamins-Minerals (MULTIVITAMIN WITH MINERALS) tablet Take 1 tablet by mouth daily. Reported on 12/03/2015  . pantoprazole (PROTONIX) 40 MG tablet 1 tab by mouth in the morning 30 minutes before breakfast  . pramipexole (MIRAPEX) 0.25 MG tablet TOME UNA TABLETA POR VIA ORAL TRES VECES POR DIA   No Known Allergies   Review of Systems  Constitutional: Negative for appetite change, fatigue and fever.  HENT: Positive for dental problem (Does not have orange card--but does have a broken tooth.  ). Negative for ear pain, hearing loss, rhinorrhea, sneezing and sore throat.        No change in tongue symptoms with Mirapex.  Did go to ENT, who once again, did not find any abnormality.  She did get a mouth rinse, but was told there was nothing wrong.  She reports she asked for testing/biopsy, but again, was told there was nothing to biopsy or test.  This is a second opinion for this complaint by an ENT.  Eyes: Negative for visual disturbance (Reading glasses.  [facility]es [facility] [employer] [facility]  Respiratory: Negative for cough and shortness of breath.  Cardiovascular: Positive for palpitations (Mild when does not walk ). Negative for chest pain and leg swelling.  Gastrointestinal: Positive for constipation (Daily.  Drinking a shake with oatmeal and banana.  Discussed the latter can worsen constipation.). Negative for abdominal pain, blood in stool (No melena) and diarrhea.  Genitourinary: Negative for dysuria, pelvic pain, vaginal bleeding and vaginal discharge.  Musculoskeletal: Positive for back pain (Has been evaluated for her lumbar spinal stenosis at [facility] and with a cyst.  Did get PT and her daily exerices control the pain significantly.). Negative for arthralgias.  Skin: Negative for rash.  Neurological: Negative for weakness and  numbness (only at times when holding phon with left hand, will she get numbness, but goes away when brings arm down.).  Psychiatric/Behavioral: Negative for dysphoric mood. The patient is not nervous/anxious.       Objective:   BP (!) 148/78 (BP Location: Left Arm, Patient Position: Sitting, Cuff Size: Normal)   Pulse 62   Resp 12   Ht 5' 0.75" (1.543 m)   Wt 155 lb (70.3 kg)   LMP  (LMP Unknown)   BMI 29.53 kg/m   Physical Exam  Constitutional: She is oriented to person, place, and time. She appears well-developed and well-nourished.  HENT:  Head: Normocephalic and atraumatic.  Right Ear: Hearing, tympanic membrane, external ear and ear canal normal.  Left Ear: Hearing, tympanic membrane, external ear and ear canal normal.  Nose: Nose normal.  Mouth/Throat: Uvula is midline, oropharynx is clear and moist and mucous membranes are normal.  Tongue appears normal  Eyes: Pupils are equal, round, and reactive to light. Conjunctivae and EOM are normal.  Neck: Normal range of motion and full passive range of motion without pain. Neck supple. No thyromegaly present.  Cardiovascular: Normal rate, regular rhythm, S1 normal and S2 normal. Exam reveals no S3, no S4 and no friction rub.  No murmur heard. No carotid bruits.  Carotid, radial, femoral, DP and PT pulses normal and equal.   Pulmonary/Chest: Effort normal and breath sounds normal. Right breast exhibits no inverted nipple, no mass, no nipple discharge, no skin change and no tenderness. Left breast exhibits no inverted nipple, no mass, no nipple discharge, no skin change and no tenderness.  Abdominal: Soft. Bowel sounds are normal. There is no hepatosplenomegaly. There is no abdominal tenderness. No hernia.  Genitourinary:    Genitourinary Comments: Normal external female genitalia with atrophic changes.  No cervical or vaginal lesion. No uterine or adnexal mass or tenderness.   Musculoskeletal: Normal range of motion.     Comments:  Left wrist with mild disfigurement and decreased ROM.  Difficulty making a complete fist with left hand.   Lymphadenopathy:       Head (right side): No submental and no submandibular adenopathy present.       Head (left side): No submental and no submandibular adenopathy present.    She has no cervical adenopathy.    She has no axillary adenopathy.       Right: No inguinal and no supraclavicular adenopathy present.       Left: No inguinal and no supraclavicular adenopathy present.  Neurological: She is alert and oriented to person, place, and time. She has normal strength and normal reflexes. No cranial nerve deficit or sensory deficit. Coordination and gait normal.  Skin: Skin is warm. No rash noted.  Psychiatric: She has a normal mood and affect. Her speech is normal and behavior is normal. Judgment and thought content normal. Cognition and  memory are normal.     Assessment & Plan  1.  CPE with pap--if this is normal, no further paps unless she becomes sexually active with a new female partner. She will call if she does not hear from [facility] regarding mammogram appt FLP, CBC, CMP, A1C Influenza vaccine. Pneumococcal 13 Return guaiac cards in 2 weeks. She will contemplate colonoscopy next year--DEXA this year.  Due to cost.  2.  Osteopenia:  DXA bone density.  Getting enough calcium and Vitamin D.  Encouraged weight bearing exercise.  Also to make sure she is using her rubber ball to squeeze regularly with left hand as has some loss of ROM and strength in that hand after her wrist fracture from August.  3.  Tongue complaints:  No better.  Has been evaluated extensively.  I do not believe she has necessarily delusional thinking with this as has been suggested in the past, but she is focused on these symptoms. Discussed weaning her Pramipexole over the next 3 weeks as does not seem to help  4.  Elevated  BP with weight gain:  To work on diet and physical activity.  BP check in 4 weeks  and with me in 4 months.  5.  Prediabetes:  As in #4.  A1C

## 2019-08-02 ENCOUNTER — Other Ambulatory Visit: Payer: Self-pay | Admitting: Internal Medicine

## 2019-08-02 LAB — COMPREHENSIVE METABOLIC PANEL
ALT: 38 IU/L — ABNORMAL HIGH (ref 0–32)
AST: 37 IU/L (ref 0–40)
Albumin/Globulin Ratio: 1.7 (ref 1.2–2.2)
Albumin: 4.6 g/dL (ref 3.8–4.8)
Alkaline Phosphatase: 70 IU/L (ref 39–117)
BUN/Creatinine Ratio: 17 (ref 12–28)
BUN: 15 mg/dL (ref 8–27)
Bilirubin Total: 0.5 mg/dL (ref 0.0–1.2)
CO2: 20 mmol/L (ref 20–29)
Calcium: 9.3 mg/dL (ref 8.7–10.3)
Chloride: 102 mmol/L (ref 96–106)
Creatinine, Ser: 0.89 mg/dL (ref 0.57–1.00)
GFR calc Af Amer: 78 mL/min/{1.73_m2} (ref >59)
GFR calc non Af Amer: 68 mL/min/{1.73_m2} (ref >59)
Globulin, Total: 2.7 g/dL (ref 1.5–4.5)
Glucose: 105 mg/dL — ABNORMAL HIGH (ref 65–99)
Potassium: 4.3 mmol/L (ref 3.5–5.2)
Sodium: 140 mmol/L (ref 134–144)
Total Protein: 7.3 g/dL (ref 6.0–8.5)

## 2019-08-02 LAB — CBC WITH DIFFERENTIAL/PLATELET
Basophils Absolute: 0 10*3/uL (ref 0.0–0.2)
Basos: 1 %
EOS (ABSOLUTE): 0.1 10*3/uL (ref 0.0–0.4)
Eos: 1 %
Hematocrit: 40.7 % (ref 34.0–46.6)
Hemoglobin: 13.4 g/dL (ref 11.1–15.9)
Immature Grans (Abs): 0 10*3/uL (ref 0.0–0.1)
Immature Granulocytes: 0 %
Lymphocytes Absolute: 1.6 10*3/uL (ref 0.7–3.1)
Lymphs: 26 %
MCH: 29.3 pg (ref 26.6–33.0)
MCHC: 32.9 g/dL (ref 31.5–35.7)
MCV: 89 fL (ref 79–97)
Monocytes Absolute: 0.4 10*3/uL (ref 0.1–0.9)
Monocytes: 7 %
Neutrophils Absolute: 3.9 10*3/uL (ref 1.4–7.0)
Neutrophils: 65 %
Platelets: 234 10*3/uL (ref 150–450)
RBC: 4.58 x10E6/uL (ref 3.77–5.28)
RDW: 13.4 % (ref 11.7–15.4)
WBC: 6 10*3/uL (ref 3.4–10.8)

## 2019-08-02 LAB — HGB A1C W/O EAG: Hgb A1c MFr Bld: 5.9 % — ABNORMAL HIGH (ref 4.8–5.6)

## 2019-08-02 LAB — LIPID PANEL W/O CHOL/HDL RATIO
Cholesterol, Total: 216 mg/dL — ABNORMAL HIGH (ref 100–199)
HDL: 58 mg/dL (ref >39)
LDL Chol Calc (NIH): 128 mg/dL — ABNORMAL HIGH (ref 0–99)
Triglycerides: 169 mg/dL — ABNORMAL HIGH (ref 0–149)
VLDL Cholesterol Cal: 30 mg/dL (ref 5–40)

## 2019-08-05 LAB — CYTOLOGY - PAP

## 2019-08-22 ENCOUNTER — Other Ambulatory Visit (INDEPENDENT_AMBULATORY_CARE_PROVIDER_SITE_OTHER): Payer: Self-pay

## 2019-08-22 DIAGNOSIS — Z1211 Encounter for screening for malignant neoplasm of colon: Secondary | ICD-10-CM

## 2019-08-22 LAB — POC HEMOCCULT BLD/STL (HOME/3-CARD/SCREEN)
Card #2 Fecal Occult Blod, POC: NEGATIVE
Card #3 Fecal Occult Blood, POC: NEGATIVE
Fecal Occult Blood, POC: NEGATIVE

## 2019-08-30 ENCOUNTER — Other Ambulatory Visit: Payer: Self-pay

## 2019-08-30 ENCOUNTER — Ambulatory Visit (INDEPENDENT_AMBULATORY_CARE_PROVIDER_SITE_OTHER): Payer: Self-pay

## 2019-08-30 VITALS — BP 136/78 | HR 70

## 2019-08-30 DIAGNOSIS — R030 Elevated blood-pressure reading, without diagnosis of hypertension: Secondary | ICD-10-CM

## 2019-09-09 ENCOUNTER — Telehealth: Payer: Self-pay | Admitting: Internal Medicine

## 2019-09-09 NOTE — Telephone Encounter (Signed)
Patient called requesting Rx on pantoprazole (PROTONIX) 40 MG tablet to be called in at [facility].  Please advise.

## 2019-09-11 ENCOUNTER — Other Ambulatory Visit: Payer: Self-pay

## 2019-09-11 MED ORDER — PANTOPRAZOLE SODIUM 40 MG PO TBEC: DELAYED_RELEASE_TABLET | ORAL | 5 refills | Status: DC

## 2019-09-11 NOTE — Telephone Encounter (Signed)
Rx sent to [facility] 

## 2019-11-11 ENCOUNTER — Ambulatory Visit: Payer: No Typology Code available for payment source | Attending: Family

## 2019-11-11 DIAGNOSIS — Z23 Encounter for immunization: Secondary | ICD-10-CM | POA: Insufficient documentation

## 2019-11-11 NOTE — Progress Notes (Signed)
   Covid-19 Vaccination Clinic  Name:  Ann Chase    MRN: [id number] DOB: 1951/01/20  11/11/2019  Ann Chase was observed post Covid-19 immunization for 15 minutes without incidence. She was provided with Vaccine Information Sheet and instruction to access the V-Safe system.   Ann Chase was instructed to call 911 with any severe reactions post vaccine: Marland Kitchen Difficulty breathing  . Swelling of your face and throat  . A fast heartbeat  . A bad rash all over your body  . Dizziness and weakness    Immunizations Administered    Name Date Dose VIS Date Route   Moderna COVID-19 Vaccine 11/11/2019  3:08 PM 0.5 mL 08/20/2019 Intramuscular   Manufacturer: Gala Murdoch   Lot: [id number]   NDC: 21624-469-50     Reviewed severe side effects in Spanish with patient.

## 2019-11-28 ENCOUNTER — Ambulatory Visit: Payer: Self-pay | Admitting: Internal Medicine

## 2019-11-28 ENCOUNTER — Encounter: Payer: Self-pay | Admitting: Internal Medicine

## 2019-11-28 ENCOUNTER — Other Ambulatory Visit: Payer: Self-pay

## 2019-11-28 VITALS — BP 138/80 | HR 78 | Resp 12 | Ht 60.75 in | Wt 155.0 lb

## 2019-11-28 DIAGNOSIS — E663 Overweight: Secondary | ICD-10-CM

## 2019-11-28 DIAGNOSIS — K219 Gastro-esophageal reflux disease without esophagitis: Secondary | ICD-10-CM

## 2019-11-28 DIAGNOSIS — R7303 Prediabetes: Secondary | ICD-10-CM

## 2019-11-28 DIAGNOSIS — R030 Elevated blood-pressure reading, without diagnosis of hypertension: Secondary | ICD-10-CM

## 2019-11-28 NOTE — Patient Instructions (Addendum)
[place] folks:  Can Ann Chase have whole grain bread with her sandwiches?   [name] would be better than ham for her, but if she prefers ham that's okay. Needs to avoid salad dressing like [place] and likely the Honey Mustard due to sugar  Tome un vaso de agua antes de cada comida Tome un minimo de 6 a 8 vasos de agua diarios Coma tres veces al dia Coma una proteina y [name] grasa saludable con comida.  (huevos, pescado, pollo, pavo, y limite carnes rojas Coma 5 porciones diarias de legumbres.  Mezcle los colores Coma 2 porciones diarias de frutas con cascara cuando sea comestible Use platos pequeos Suelte su tenedor o cuchara despues de cada mordida hata que se mastique y se trague Come en la mesa con amigos o familiares por lo menos una vez al dia [place] la televisin y aparatos electrnicos durante la comida  Su objetivo debe ser perder una libra por semana  Estudios recientes indican que las personas quienes consumen todos de sus calorias durante 12 horas se bajan de pesocon Mas eficiencia.  Por ejemplo, si Usted come su primera comida a las 7:00 a.m., su comida final del dia se debe completar antes de las 7:00 p.m.

## 2019-11-28 NOTE — Progress Notes (Signed)
    Subjective:    Patient ID: Ann Chase, female   DOB: May 25, 1951, 69 y.o.   MRN: [id number]   HPI   Interpreted by [name]  1.  Weight gain with increase in BP/prediabetes:  Weight is the same.  States she is eating smaller portions.  Has cut fat and starches.  Not eating much in way of veggies as she does not like.  9 a.m.:  Eats oatmeal.  Sometimes with apple pieces.  Does not drink anything with this 12:  White bread sandwich with ham or [name], roasted chicken or egg.  Sometimes fried or roasted chicken.  Eats a sandwich with her work at the [employer]. May eat a stuffed [place] with cheese with fried egg. Coffee with 2% milk Ginger tea with lime Rose water  4 p.m.:  Egg and sausage and tomatoes.  [name] the egg.  Not physically active.   2.  GERD:  Has questions about hiatal hernia and how that increases her risk for GERD.      Current Meds  Medication Sig  . Calcium Carbonate-Vitamin D (CALCIUM 600+D) 600-400 MG-UNIT per tablet Take 1 tablet by mouth 2 (two) times daily. Reported on 12/03/2015  . carboxymethylcellulose (REFRESH PLUS) 0.5 % SOLN 1 drop daily as needed.  . Multiple Vitamins-Minerals (MULTIVITAMIN WITH MINERALS) tablet Take 1 tablet by mouth daily. Reported on 12/03/2015  . pantoprazole (PROTONIX) 40 MG tablet 1 tab by mouth in the morning 30 minutes before breakfast   No Known Allergies   Review of Systems    Objective:   BP 138/80 (BP Location: Left Arm, Patient Position: Sitting, Cuff Size: Normal)   Pulse 78   Resp 12   Ht 5' 0.75" (1.543 m)   Wt 155 lb (70.3 kg)   LMP  (LMP Unknown)   BMI 29.53 kg/m   Physical Exam  NAD Lungs:  CTA CV:  RRR without murmur or rub.  Radial and DP pulses normal and equal Heberdon's nodes on DIPs of fingers.  Mild ulnar deviation of left hand with previous fracture of wrist. LE:  No edema.   Assessment & Plan  1.  Overweight with prediabetes and elevated BP.  She  states she is motivated to improve diet and get started with goal making for daily physical activity outside of work. 1 hour face to face with patient discussing this and working on goals  2.  GERD:  Discussed how her diagnosis of hiatal hernia increases her risk for GERD.

## 2019-12-10 ENCOUNTER — Ambulatory Visit: Payer: No Typology Code available for payment source | Attending: Family

## 2019-12-10 DIAGNOSIS — Z23 Encounter for immunization: Secondary | ICD-10-CM

## 2019-12-10 NOTE — Progress Notes (Signed)
   Covid-19 Vaccination Clinic  Name:  [name]    MRN: [id number] DOB: 26-Apr-1951  12/10/2019  [name] was observed post Covid-19 immunization for 15 minutes without incident. She was provided with Vaccine Information Sheet and instruction to access the V-Safe system.   [name] was instructed to call 911 with any severe reactions post vaccine: Marland Kitchen Difficulty breathing  . Swelling of face and throat  . A fast heartbeat  . A bad rash all over body  . Dizziness and weakness   Immunizations Administered    Name Date Dose VIS Date Route   Moderna COVID-19 Vaccine 12/10/2019  9:49 AM 0.5 mL 08/20/2019 Intramuscular   Manufacturer: Moderna   Lot: [id number]   NDC: 32355-732-20

## 2019-12-24 ENCOUNTER — Ambulatory Visit: Payer: Self-pay

## 2020-03-31 ENCOUNTER — Other Ambulatory Visit (INDEPENDENT_AMBULATORY_CARE_PROVIDER_SITE_OTHER): Payer: Self-pay

## 2020-03-31 DIAGNOSIS — Z13220 Encounter for screening for lipoid disorders: Secondary | ICD-10-CM

## 2020-03-31 DIAGNOSIS — R7303 Prediabetes: Secondary | ICD-10-CM

## 2020-04-01 LAB — LIPID PANEL W/O CHOL/HDL RATIO
Cholesterol, Total: 179 mg/dL (ref 100–199)
HDL: 49 mg/dL (ref >39)
LDL Chol Calc (NIH): 112 mg/dL — ABNORMAL HIGH (ref 0–99)
Triglycerides: 99 mg/dL (ref 0–149)
VLDL Cholesterol Cal: 18 mg/dL (ref 5–40)

## 2020-04-01 LAB — HGB A1C W/O EAG: Hgb A1c MFr Bld: 6.1 % — ABNORMAL HIGH (ref 4.8–5.6)

## 2020-04-02 ENCOUNTER — Ambulatory Visit: Payer: Self-pay | Admitting: Internal Medicine

## 2020-04-23 ENCOUNTER — Other Ambulatory Visit: Payer: Self-pay

## 2020-04-23 MED ORDER — PANTOPRAZOLE SODIUM 40 MG PO TBEC: DELAYED_RELEASE_TABLET | ORAL | 5 refills | Status: DC

## 2020-05-20 ENCOUNTER — Ambulatory Visit: Payer: Self-pay | Admitting: Internal Medicine

## 2020-05-21 ENCOUNTER — Ambulatory Visit: Payer: Self-pay | Admitting: Internal Medicine

## 2020-07-15 ENCOUNTER — Ambulatory Visit: Payer: Self-pay | Admitting: Internal Medicine

## 2020-07-15 ENCOUNTER — Encounter: Payer: Self-pay | Admitting: Internal Medicine

## 2020-07-15 VITALS — BP 148/82 | HR 75 | Resp 12 | Ht 60.75 in | Wt 150.0 lb

## 2020-07-15 DIAGNOSIS — R030 Elevated blood-pressure reading, without diagnosis of hypertension: Secondary | ICD-10-CM

## 2020-07-15 DIAGNOSIS — K219 Gastro-esophageal reflux disease without esophagitis: Secondary | ICD-10-CM

## 2020-07-15 DIAGNOSIS — R7303 Prediabetes: Secondary | ICD-10-CM

## 2020-07-15 DIAGNOSIS — Z1231 Encounter for screening mammogram for malignant neoplasm of breast: Secondary | ICD-10-CM

## 2020-07-15 DIAGNOSIS — E7800 Pure hypercholesterolemia, unspecified: Secondary | ICD-10-CM

## 2020-07-15 NOTE — Progress Notes (Signed)
    Subjective:    Patient ID: Ann Chase, female   DOB: [birth year], 69 y.o.   MRN: [id number]   HPI   Was to follow up after labs in July, but canceled appt.  Here to discuss today.  1.   Hypercholesterolemia with mildly elevated triglycerides.:   Lipid Panel     Component Value Date/Time   CHOL 179 03/31/2020 0857   TRIG 99 03/31/2020 0857   HDL 49 03/31/2020 0857   CHOLHDL 3.7 Ratio 01/21/2010 1836   VLDL 28 01/21/2010 1836   LDLCALC 112 (H) 03/31/2020 0857   LABVLDL 18 03/31/2020 0857    2.  Prediabetes:  A1C back up a bit to 6.1%.  She is down 5 lbs since March when last weighed.  Describes decreasing sugar and bread/cake/other carbs from diet.    3.  Elevated BP:  Stressed as she lost her job recently after working at her previous place of work for 18 years.  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  4. Had eyes checked back in June and now with bifocals.  She sees well with them.  Current Meds  Medication Sig   Calcium Carbonate-Vitamin D (CALCIUM 600+D) 600-400 MG-UNIT per tablet Take 1 tablet by mouth 2 (two) times daily. Reported on 12/03/2015   carboxymethylcellulose (REFRESH PLUS) 0.5 % SOLN 1 drop daily as needed.   Multiple Vitamins-Minerals (MULTIVITAMIN WITH MINERALS) tablet Take 1 tablet by mouth daily. Reported on 12/03/2015   pantoprazole (PROTONIX) 40 MG tablet 1 tab by mouth in the morning 30 minutes before breakfast   No Known Allergies   Review of Systems    Objective:   BP (!) 148/82 (BP Location: Right Arm, Patient Position: Sitting, Cuff Size: Normal)   Pulse 75   Resp 12   Ht 5' 0.75" (1.543 m)   Wt 150 lb (68 kg)   LMP  (LMP Unknown)   BMI 28.58 kg/m   Physical Exam NAD HEENT:  PERRL, EOMI, Discs sharp, TMs pearly gray Throat without injection Neck:  Supple, No adenopathy Chest:  CTA CV:  RRR with normal S1 and S2, No S3, S4 or murmur.  No carotid bruits.  Carotid, radial and DP pulses normal  and equal Abd:  S, NT, No HSM or mass + BS LE:  No edema.   Assessment & Plan   Hypercholesterolemia:  controlled with diet/physical activity  2  Prediabetes:  A1C today.  Controlled previously with diet and physical activity.    3.  GERD:  continue Pantoprazole  4.  Elevated BP without diagnosis of hypertension:  BP check with pneumococcal 23 vaccine mid November.   5.  HM:  Mammogram ordered.  CPE in 4 months.  Encouraged Moderna booster Monday, Nov 1.   Flyer for [employer] [facility] [employer] to see if can get some job support.   Also, legal aid referral.

## 2020-07-16 LAB — HGB A1C W/O EAG: Hgb A1c MFr Bld: 5.9 % — ABNORMAL HIGH (ref 4.8–5.6)

## 2020-08-07 ENCOUNTER — Ambulatory Visit: Payer: Self-pay

## 2020-08-07 ENCOUNTER — Other Ambulatory Visit: Payer: Self-pay

## 2020-08-07 VITALS — BP 130/80 | HR 80

## 2020-08-07 DIAGNOSIS — Z23 Encounter for immunization: Secondary | ICD-10-CM

## 2020-08-25 ENCOUNTER — Other Ambulatory Visit: Payer: Self-pay

## 2020-08-25 ENCOUNTER — Ambulatory Visit
Admission: RE | Admit: 2020-08-25 | Discharge: 2020-08-25 | Disposition: A | Discharge status: Home / Self Care | Payer: No Typology Code available for payment source | Source: Ambulatory Visit | Attending: Internal Medicine | Admitting: Internal Medicine

## 2020-08-25 DIAGNOSIS — Z1231 Encounter for screening mammogram for malignant neoplasm of breast: Secondary | ICD-10-CM

## 2020-11-19 ENCOUNTER — Ambulatory Visit: Payer: Self-pay | Admitting: Internal Medicine

## 2020-11-19 ENCOUNTER — Other Ambulatory Visit: Payer: Self-pay

## 2020-11-19 ENCOUNTER — Encounter: Payer: Self-pay | Admitting: Internal Medicine

## 2020-11-19 VITALS — BP 130/78 | HR 72 | Resp 20 | Ht 61.0 in | Wt 144.8 lb

## 2020-11-19 DIAGNOSIS — E7800 Pure hypercholesterolemia, unspecified: Secondary | ICD-10-CM

## 2020-11-19 DIAGNOSIS — Z0000 Encounter for general adult medical examination without abnormal findings: Secondary | ICD-10-CM

## 2020-11-19 DIAGNOSIS — E663 Overweight: Secondary | ICD-10-CM

## 2020-11-19 DIAGNOSIS — K449 Diaphragmatic hernia without obstruction or gangrene: Secondary | ICD-10-CM | POA: Insufficient documentation

## 2020-11-19 DIAGNOSIS — R7303 Prediabetes: Secondary | ICD-10-CM

## 2020-11-19 DIAGNOSIS — K219 Gastro-esophageal reflux disease without esophagitis: Secondary | ICD-10-CM

## 2020-11-19 NOTE — Progress Notes (Signed)
Subjective:    Patient ID: Ann Chase, female   DOB: 1951-05-13, 70 y.o.   MRN: [id number]   HPI   CPE without pap  1.  Pap:  Last November 2020 and normal.  Does not really require paps any longer unless new female partner or unexplained symptoms.  2.  Mammogram:  Last mammogram was 08/25/20 and normal.  No family history of breast cancer.    3.  Osteoprevention:  Taking adequate calcium, but all at once.  Not sure how much Vitamin D she is taking.  Discussed taking calcium 1 tab twice daily.  She walks 1 hour daily when not cold.    History of osteopenia with bone density in 2008.  Has not had repeated.  She would like to know how much this costs to repeat.    4.  Guaiac Cards:  Last performed 2020.    5.  Colonoscopy:  Performed in 2015, she does not recall where in [place].  Was told was normal.  No family history of colon cancer.  6.  Immunizations:  Did get her booster for Moderna--obtained here.   Not clear if received influenza vaccine this year. Discussed obtaining Shingrix  Immunization History  Administered Date(s) Administered   Influenza Inj Mdck Quad Pf 08/01/2019   Moderna Sars-Covid-2 Vaccination 11/11/2019, 12/10/2019   Pneumococcal Conjugate-13 08/01/2019   Pneumococcal Polysaccharide-23 08/07/2020   Tdap 06/30/2016    7.  Glucose/Cholesterol:   History of prediabetes.  Last A1C was 5.9% in 06/2020.  Cholesterol was fine . Lipid Panel     Component Value Date/Time   CHOL 179 03/31/2020 0857   TRIG 99 03/31/2020 0857   HDL 49 03/31/2020 0857   CHOLHDL 3.7 Ratio 01/21/2010 1836   VLDL 28 01/21/2010 1836   LDLCALC 112 (H) 03/31/2020 0857   LABVLDL 18 03/31/2020 0857     Current Meds  Medication Sig   Calcium Carbonate-Vitamin D 600-400 MG-UNIT tablet Take 1 tablet by mouth 2 (two) times daily. Reported on 12/03/2015   Multiple Vitamins-Minerals (MULTIVITAMIN WITH MINERALS) tablet Take 1 tablet by mouth daily. Reported on 12/03/2015    pantoprazole (PROTONIX) 40 MG tablet 1 tab by mouth in the morning 30 minutes before breakfast   Propylene Glycol (SYSTANE COMPLETE OP) Apply 1 drop to eye 2 (two) times daily.   No Known Allergies  Past Medical History:  Diagnosis Date   Arthritis    Delusions (HCC) 2013   GERD (gastroesophageal reflux disease)    Lumbar radiculopathy, right 01/26/2017   Osteopenia    Paranoia (psychosis) (HCC) 2013   Prediabetes 2013   A1C as high as 6.0%.Weighed 20 lbs more at the time   Tongue disorder    Not clear if an organic process   Past Surgical History:  Procedure Laterality Date   BREAST BIOPSY Right 7/15/005   LIPOMA EXCISION N/A 1985   Family History  Problem Relation Age of Onset   Cirrhosis Father    Hepatitis Father        Unknown viral etiology   Osteoporosis Sister    Benign prostatic hyperplasia Brother    Diabetes Mellitus II Son    Hypertension Son    Obesity Son    Social History   Socioeconomic History   Marital status: Legally Separated    Spouse name: Not on file   Number of children: 3   Years of education: 8   Highest education level: Not on file  Occupational History   Occupation:  Restaurant dishwasher and cleaner    Comment: part time- Does not wear gloves when washing with harsh chemicals.  Tobacco Use   Smoking status: Never   Smokeless tobacco: Never  Vaping Use   Vaping Use: Never used  Substance and Sexual Activity   Alcohol use: No   Drug use: No   Sexual activity: Not Currently  Other Topics Concern   Not on file  Social History Narrative   One son lives near her   She lives alone in her own home.   No friends live nearby--her friendships are with her coworkers   Her son's family has made sure she has food and money during her nonworking time of the pandemic.     No current needs.   She checked in with [facility] after visit in the fall, but did not find any job options through them   Social Determinants of Health   Financial Resource  Strain: Not on file  Food Insecurity: Not on file  Transportation Needs: Not on file  Physical Activity: Not on file  Stress: Not on file  Social Connections: Not on file  Intimate Partner Violence: Not on file     Review of Systems  Respiratory: Negative for shortness of breath.   Cardiovascular: Negative for chest pain, palpitations and leg swelling.  Gastrointestinal: Negative for abdominal pain and blood in stool (No melena).      Objective:   BP 130/78 (BP Location: Right Arm, Patient Position: Sitting, Cuff Size: Normal)   Pulse 72   Resp 20   Ht 5\' 1"  (1.549 m)   Wt 144 lb 12 oz (65.7 kg)   LMP  (LMP Unknown)   BMI 27.35 kg/m   Physical Exam HENT:     Head: Normocephalic and atraumatic.     Right Ear: Tympanic membrane, ear canal and external ear normal.     Left Ear: Tympanic membrane, ear canal and external ear normal.     Nose: Nose normal.     Mouth/Throat:     Mouth: Mucous membranes are moist.     Pharynx: Oropharynx is clear.  Eyes:     Extraocular Movements: Extraocular movements intact.     Conjunctiva/sclera: Conjunctivae normal.     Pupils: Pupils are equal, round, and reactive to light.     Comments: Discs sharp bilaterally.  Neck:     Thyroid: No thyroid mass or thyromegaly.  Cardiovascular:     Pulses: Normal pulses.     Heart sounds: S1 normal and S2 normal. No murmur heard.   No friction rub. No S3 or S4 sounds.     Comments: No carotid bruits.  Carotid, radial, femoral, DP and PT pulses normal and equal.    Pulmonary:     Effort: Pulmonary effort is normal.     Breath sounds: Normal breath sounds.  Chest:  Breasts:    Right: No inverted nipple, mass, nipple discharge, skin change or tenderness.     Left: No inverted nipple, mass, nipple discharge, skin change or tenderness.  Abdominal:     General: Bowel sounds are normal.     Palpations: Abdomen is soft. There is no hepatomegaly, splenomegaly or mass.     Tenderness: There is no  abdominal tenderness.     Hernia: No hernia is present.  Genitourinary:    Comments: Normal external female genitalia. No uterine or adnexal mass or tenderness.   Difficulty relaxing pelvic/abdominal musculature for optimal exam. Musculoskeletal:  General: Normal range of motion.     Cervical back: Normal range of motion and neck supple.     Right lower leg: No edema.     Left lower leg: No edema.     Comments: Mild thoracic kyphosis.  Heberdon's nodes of DIPs, hypertrophic changes to Phalangeal joints.    Lymphadenopathy:     Head:     Right side of head: No submental or submandibular adenopathy.     Left side of head: No submental or submandibular adenopathy.     Cervical: No cervical adenopathy.     Upper Body:     Right upper body: No supraclavicular or axillary adenopathy.     Left upper body: No supraclavicular or axillary adenopathy.     Lower Body: No right inguinal adenopathy. No left inguinal adenopathy.  Skin:    General: Skin is warm.     Capillary Refill: Capillary refill takes less than 2 seconds.     Findings: No rash.  Neurological:     Mental Status: She is alert.     Cranial Nerves: Cranial nerves are intact.     Sensory: Sensation is intact.     Motor: Motor function is intact.     Coordination: Coordination is intact.     Gait: Gait is intact.     Deep Tendon Reflexes: Reflexes are normal and symmetric.  Psychiatric:        Mood and Affect: Mood normal.        Behavior: Behavior normal.     Assessment & Plan   CPE Mammogram done and normal Check into cost of DEXA Encouraged yearly influenza vaccine Encouraged Shingrix vaccine--check with [facility] for lower cost as we do not have available.   CBC, CMP. Guaiac cards x 3 to return in 2 weeks.  2.  Overweight/Prediabetes:  Continue to work on lifestyle change.  A1C  3.  GERD/HH:  Pantoprazole.  3.  History of hypercholesterolemia:  FLP.

## 2020-11-19 NOTE — Patient Instructions (Addendum)
Check on getting SHingles vaccination Call in September for influenza vaccine

## 2020-11-20 LAB — CBC WITH DIFFERENTIAL/PLATELET
Basophils Absolute: 0 10*3/uL (ref 0.0–0.2)
Basos: 1 %
EOS (ABSOLUTE): 0.1 10*3/uL (ref 0.0–0.4)
Eos: 1 %
Hematocrit: 43.8 % (ref 34.0–46.6)
Hemoglobin: 14.4 g/dL (ref 11.1–15.9)
Immature Grans (Abs): 0 10*3/uL (ref 0.0–0.1)
Immature Granulocytes: 0 %
Lymphocytes Absolute: 2.1 10*3/uL (ref 0.7–3.1)
Lymphs: 33 %
MCH: 29.2 pg (ref 26.6–33.0)
MCHC: 32.9 g/dL (ref 31.5–35.7)
MCV: 89 fL (ref 79–97)
Monocytes Absolute: 0.5 10*3/uL (ref 0.1–0.9)
Monocytes: 7 %
Neutrophils Absolute: 3.9 10*3/uL (ref 1.4–7.0)
Neutrophils: 58 %
Platelets: 242 10*3/uL (ref 150–450)
RBC: 4.93 x10E6/uL (ref 3.77–5.28)
RDW: 13.1 % (ref 11.7–15.4)
WBC: 6.6 10*3/uL (ref 3.4–10.8)

## 2020-11-20 LAB — COMPREHENSIVE METABOLIC PANEL
ALT: 18 IU/L (ref 0–32)
AST: 27 IU/L (ref 0–40)
Albumin/Globulin Ratio: 1.6 (ref 1.2–2.2)
Albumin: 4.7 g/dL (ref 3.8–4.8)
Alkaline Phosphatase: 64 IU/L (ref 44–121)
BUN/Creatinine Ratio: 15 (ref 12–28)
BUN: 13 mg/dL (ref 8–27)
Bilirubin Total: 0.7 mg/dL (ref 0.0–1.2)
CO2: 22 mmol/L (ref 20–29)
Calcium: 9.7 mg/dL (ref 8.7–10.3)
Chloride: 103 mmol/L (ref 96–106)
Creatinine, Ser: 0.87 mg/dL (ref 0.57–1.00)
Globulin, Total: 3 g/dL (ref 1.5–4.5)
Glucose: 90 mg/dL (ref 65–99)
Potassium: 4.5 mmol/L (ref 3.5–5.2)
Sodium: 140 mmol/L (ref 134–144)
Total Protein: 7.7 g/dL (ref 6.0–8.5)
eGFR: 73 mL/min/{1.73_m2} (ref >59)

## 2020-11-20 LAB — LIPID PANEL W/O CHOL/HDL RATIO
Cholesterol, Total: 216 mg/dL — ABNORMAL HIGH (ref 100–199)
HDL: 67 mg/dL (ref >39)
LDL Chol Calc (NIH): 133 mg/dL — ABNORMAL HIGH (ref 0–99)
Triglycerides: 93 mg/dL (ref 0–149)
VLDL Cholesterol Cal: 16 mg/dL (ref 5–40)

## 2020-11-20 LAB — HGB A1C W/O EAG: Hgb A1c MFr Bld: 6 % — ABNORMAL HIGH (ref 4.8–5.6)

## 2020-12-07 ENCOUNTER — Other Ambulatory Visit (INDEPENDENT_AMBULATORY_CARE_PROVIDER_SITE_OTHER): Payer: Self-pay | Admitting: Internal Medicine

## 2020-12-07 DIAGNOSIS — Z1211 Encounter for screening for malignant neoplasm of colon: Secondary | ICD-10-CM

## 2020-12-10 LAB — POC HEMOCCULT BLD/STL (HOME/3-CARD/SCREEN)
Card #2 Fecal Occult Blod, POC: NEGATIVE
Card #3 Fecal Occult Blood, POC: NEGATIVE
Fecal Occult Blood, POC: NEGATIVE

## 2021-03-23 ENCOUNTER — Telehealth: Payer: Self-pay | Admitting: Internal Medicine

## 2021-03-23 NOTE — Telephone Encounter (Signed)
Patient called requesting a refill of Pantoprazole (PROTONIX) 40 MG tablet. If you can please send it to the [facility] pharmacy at [address].

## 2021-03-30 ENCOUNTER — Telehealth: Payer: Self-pay

## 2021-03-30 NOTE — Telephone Encounter (Signed)
Pt called to ask for a refill of Pantoprazole 40mg  tablet. Pt has an appointment on 05/26/21

## 2021-03-31 MED ORDER — PANTOPRAZOLE SODIUM 40 MG PO TBEC: DELAYED_RELEASE_TABLET | ORAL | 5 refills | Status: DC

## 2021-03-31 NOTE — Telephone Encounter (Signed)
done

## 2021-04-01 NOTE — Telephone Encounter (Signed)
Pt.notified

## 2021-04-02 ENCOUNTER — Other Ambulatory Visit: Payer: Self-pay | Admitting: Internal Medicine

## 2021-05-26 ENCOUNTER — Encounter: Payer: Self-pay | Admitting: Internal Medicine

## 2021-05-26 ENCOUNTER — Ambulatory Visit: Payer: Self-pay | Admitting: Internal Medicine

## 2021-05-26 ENCOUNTER — Other Ambulatory Visit: Payer: Self-pay

## 2021-05-26 VITALS — BP 106/70 | HR 64 | Resp 16 | Ht 61.0 in | Wt 137.0 lb

## 2021-05-26 DIAGNOSIS — M542 Cervicalgia: Secondary | ICD-10-CM

## 2021-05-26 DIAGNOSIS — R7303 Prediabetes: Secondary | ICD-10-CM

## 2021-05-26 DIAGNOSIS — E7800 Pure hypercholesterolemia, unspecified: Secondary | ICD-10-CM

## 2021-05-26 DIAGNOSIS — M25511 Pain in right shoulder: Secondary | ICD-10-CM

## 2021-05-26 DIAGNOSIS — Z23 Encounter for immunization: Secondary | ICD-10-CM

## 2021-05-26 DIAGNOSIS — M546 Pain in thoracic spine: Secondary | ICD-10-CM

## 2021-05-26 MED ORDER — PANTOPRAZOLE SODIUM 40 MG PO TBEC
DELAYED_RELEASE_TABLET | ORAL | 11 refills | Status: DC
Start: 2021-05-26 — End: 2022-06-06

## 2021-05-26 MED ORDER — IBUPROFEN 200 MG PO TABS: 200.0000 mg | ORAL_TABLET | Freq: Four times a day (QID) | ORAL | 0 refills | Status: AC | PRN

## 2021-05-26 NOTE — Progress Notes (Signed)
Subjective:    Patient ID: Ann Chase, female   DOB: [birth year], 70 y.o.   MRN: [id number]   HPI  Tildon Husky interprets   Right arm pain:  Points to medial supraclavicular area radiating to deltoid insertion area of right shoulder and arm as where her arm hurts. At same time, has pain over what looks like the lateral spinous area of scapula is.  Feels like a shock there when has pain.  The scapular pain and shoulder pain can happen at any time--not associated with movement.  She does feel like she is losing strength in the arm and shoulder.  Has been a problem for about 1 month.  She cannot recall any injury or overuse prior to the discomfort starting.  Has not taken any medication for the discomfort.  Has tried just ROM exercises to decrease the discomfort.  2.  Prediabetes and hypercholesterolemia:  She changed her diet after last labs in March, specifically, has cut back on carbs.  She is working a job 4 days weekly with 12 hour workdays.  Has lost 7 lbs.   3.  HM:  has had only one Moderna COVID booster.  Would like to wait until can get the new bivalent vaccine instead of obtaining a regular second booster today.  Has not had shingles vaccine.    4.  GERD:  needs Rx for pantoprazole sent to another pharmacy as her orange card is expired.  5.  "Blisters"  on plantar feet:  brings this up as leaving and in waiting room:  has been to podiatrist who really doesn't do anything according to patient.  Describes having debridement of the area, but the blister returns.   Current Meds  Medication Sig   Calcium Carbonate-Vitamin D 600-400 MG-UNIT tablet Take 1 tablet by mouth 2 (two) times daily. Reported on 12/03/2015   Multiple Vitamins-Minerals (MULTIVITAMIN WITH MINERALS) tablet Take 1 tablet by mouth daily. Reported on 12/03/2015   pantoprazole (PROTONIX) 40 MG tablet 1 tab by mouth in the morning 30 minutes before breakfast   Propylene Glycol (SYSTANE COMPLETE OP) Apply 1  drop to eye 2 (two) times daily.   No Known Allergies   Review of Systems    Objective:   BP 106/70 (BP Location: Left Arm, Patient Position: Sitting, Cuff Size: Normal)   Pulse 64   Resp 16   Ht 5\' 1"  (1.549 m)   Wt 137 lb (62.1 kg)   LMP  (LMP Unknown)   BMI 25.89 kg/m   Physical Exam NAD LUngs:  CTA CV:  RRR without murmur or rub  Radial pulses normal and equal MS:  Full ROM, some discomfort with abduction at 90 degrees.  Discomfort with internal and external rotation as well.  Tender with palpation of right SCM muscle and musculature surrounding right scapula, particularly medially.  Palpable mild muscle spasm.   Left foot with calluses on plantar aspect of metatarsal heads.  No erythema.   Assessment & Plan    Prediabetes:  A1C with improved lifestyle changes.  2.  Hypercholesterolemia:  FLP as in #2  3.  Right shoulder, neck and upper thoracic back pain:  referral to [facility].  Ibuprofen 400-600 mg twice daily with food for 14 days, then as needed thereafter.  4.  Callusing over metatarsal heads:  discussed podiatrist is treating the calluses and will likely continue to need to do so.  To ask if a tarsal pad may help decrease the  callus formation  5.  HM:  Shingrix vaccine.  Prefers to wait for bivalent Moderna booster.

## 2021-05-27 LAB — LIPID PANEL W/O CHOL/HDL RATIO
Cholesterol, Total: 195 mg/dL (ref 100–199)
HDL: 61 mg/dL (ref >39)
LDL Chol Calc (NIH): 114 mg/dL — ABNORMAL HIGH (ref 0–99)
Triglycerides: 110 mg/dL (ref 0–149)
VLDL Cholesterol Cal: 20 mg/dL (ref 5–40)

## 2021-05-27 LAB — HGB A1C W/O EAG: Hgb A1c MFr Bld: 6 % — ABNORMAL HIGH (ref 4.8–5.6)

## 2021-07-23 ENCOUNTER — Other Ambulatory Visit: Payer: Self-pay | Admitting: Internal Medicine

## 2021-07-23 DIAGNOSIS — Z1231 Encounter for screening mammogram for malignant neoplasm of breast: Secondary | ICD-10-CM

## 2021-08-31 ENCOUNTER — Ambulatory Visit
Admission: RE | Admit: 2021-08-31 | Discharge: 2021-08-31 | Disposition: A | Discharge status: Home / Self Care | Payer: No Typology Code available for payment source | Source: Ambulatory Visit | Attending: Internal Medicine | Admitting: Internal Medicine

## 2021-08-31 DIAGNOSIS — Z1231 Encounter for screening mammogram for malignant neoplasm of breast: Secondary | ICD-10-CM

## 2021-11-24 ENCOUNTER — Other Ambulatory Visit: Payer: Self-pay

## 2021-11-24 ENCOUNTER — Encounter: Payer: Self-pay | Admitting: Internal Medicine

## 2021-11-24 ENCOUNTER — Ambulatory Visit: Payer: Self-pay | Admitting: Internal Medicine

## 2021-11-24 VITALS — BP 110/70 | HR 64 | Resp 12 | Ht 61.0 in | Wt 137.0 lb

## 2021-11-24 DIAGNOSIS — E7800 Pure hypercholesterolemia, unspecified: Secondary | ICD-10-CM

## 2021-11-24 DIAGNOSIS — U071 COVID-19: Secondary | ICD-10-CM

## 2021-11-24 DIAGNOSIS — Z23 Encounter for immunization: Secondary | ICD-10-CM

## 2021-11-24 DIAGNOSIS — Z0000 Encounter for general adult medical examination without abnormal findings: Secondary | ICD-10-CM

## 2021-11-24 DIAGNOSIS — L84 Corns and callosities: Secondary | ICD-10-CM

## 2021-11-24 DIAGNOSIS — M8580 Other specified disorders of bone density and structure, unspecified site: Secondary | ICD-10-CM

## 2021-11-24 DIAGNOSIS — R7303 Prediabetes: Secondary | ICD-10-CM

## 2021-11-24 NOTE — Progress Notes (Signed)
Subjective:    Patient ID: Ann Chase, female   DOB: 03-Dec-1950, 71 y.o.   MRN: [id number]   HPI  CPE without pap  1.  Pap:  Last pap 2020 when 67 and only benign reparative changes.  She does not have a partner.    2.  Mammogram:  Last performed 08/31/2021 and normal.    3.  Osteoprevention:  Had bone density 04/2007 with findings of osteopenia.   She is taking calcium with vitamin D twice daily.  Has not been able to afford repeat DEXA.    4.  Guaiac Cards/FIT:  Guaiac cards negative x 3 last year this time.    5.  Colonoscopy:  Last performed she thinks in 2015 and believes it was normal.  She states she may have paperwork at home.  Was performed as a screen.  No family history of colon cancer.    6.  Immunizations:  Has not had covid bivalent nor second Shingrix.  Immunization History  Administered Date(s) Administered   Influenza Inj Mdck Quad Pf 08/01/2019   Moderna Sars-Covid-2 Vaccination 11/11/2019, 12/10/2019, 07/20/2020   Pneumococcal Conjugate-13 08/01/2019   Pneumococcal Polysaccharide-23 08/07/2020   Tdap 06/30/2016   Zoster Recombinat (Shingrix) 05/26/2021     7.  Glucose/Cholesterol:  History of prediabetes with last A1C at 6.0% in September 2022.  Cholesterol with better control in September as compared to March 2022. Lipid Panel     Component Value Date/Time   CHOL 195 05/26/2021 1551   TRIG 110 05/26/2021 1551   HDL 61 05/26/2021 1551   CHOLHDL 3.7 Ratio 01/21/2010 1836   VLDL 28 01/21/2010 1836   LDLCALC 114 (H) 05/26/2021 1551   LABVLDL 20 05/26/2021 1551     Current Meds  Medication Sig   Calcium Carbonate-Vitamin D 600-400 MG-UNIT tablet Take 1 tablet by mouth 2 (two) times daily. Reported on 12/03/2015   ibuprofen (ADVIL) 200 MG tablet Take 1 tablet (200 mg total) by mouth every 6 (six) hours as needed.   Multiple Vitamins-Minerals (MULTIVITAMIN WITH MINERALS) tablet Take 1 tablet by mouth daily. Reported on 12/03/2015    pantoprazole (PROTONIX) 40 MG tablet 1 tab by mouth in the morning 30 minutes before breakfast   Propylene Glycol (SYSTANE COMPLETE OP) Apply 1 drop to eye 2 (two) times daily.   No Known Allergies  Past Medical History:  Diagnosis Date   Arthritis    GERD (gastroesophageal reflux disease)    Hyperlipidemia    Lumbar radiculopathy, right 01/26/2017   Osteopenia    Prediabetes 2013   A1C as high as 6.0%.Weighed 20 lbs more at the time   Tongue disorder    Not clear if an organic process   Past Surgical History:  Procedure Laterality Date   BREAST BIOPSY Right 04/02/2004   LIPOMA EXCISION N/A 1985    Family History  Problem Relation Age of Onset   Cirrhosis Father    Hepatitis Father        Unknown viral etiology   Osteoporosis Sister    Benign prostatic hyperplasia Brother    Hyperlipidemia Son    Diabetes Son    Hypertension Son    Obesity Son    Social History   Socioeconomic History   Marital status: Legally Separated    Spouse name: Not on file   Number of children: 3   Years of education: 8   Highest education level: Not on file  Occupational History   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   Occupation: Research officer, trade union  Tobacco Use   Smoking status: Never   Smokeless tobacco: Never  Vaping Use   Vaping Use: Never used  Substance and Sexual Activity   Alcohol use: No   Drug use: No   Sexual activity: Not Currently  Other Topics Concern   Not on file  Social History Narrative   One son (middle son) lives near her   She lives alone in her own home.   No friends live nearby--her friendships are with her coworkers   Her son's family has made sure she has food and money during her nonworking time of the pandemic.     No current needs.      Social Determinants of Health   Financial Resource Strain: Low Risk    Difficulty of Paying Living Expenses: Not hard at all  Food  Insecurity: No Food Insecurity   Worried About Charity fundraiser in the Last Year: Never true   [place] in the Last Year: Never true  Transportation Needs: No Transportation Needs   Lack of Transportation (Medical): No   Lack of Transportation (Non-Medical): No  Physical Activity: Not on file  Stress: Not on file  Social Connections: Not on file  Intimate Partner Violence: Not At Risk   Fear of Current or Ex-Partner: No   Emotionally Abused: No   Physically Abused: No   Sexually Abused: No      Review of Systems  Respiratory:  Negative for shortness of breath.   Cardiovascular:  Positive for chest pain (Right lateral chest pain rarely.  Tightening or muscle hardening in the area of pain.  Happens at random times.). Negative for palpitations and leg swelling.  Gastrointestinal:  Negative for abdominal pain and blood in stool (No melena.).  Genitourinary:  Negative for vaginal bleeding and vaginal discharge.  Musculoskeletal:  Positive for arthralgias (Right shoulder and upper back--working with [employer].).       Stubbed her toe on a sidewalk and injured right great toe 15 days ago.  Shows photo with blistering and redness with swelling over dorsal aspect of right toe.  Now still with mild swelling and mild dusky redness.   Not having any pain now, but still swollen.  She is able to have full ROM without pain. Went back to work 5 days ago where she packs plates and is feet whole time.  Her toe has swollen again since.   Left foot also with a callus she would like removed.  Would like to go to podiatry.  Neurological:  Negative for weakness and numbness.  Psychiatric/Behavioral:  Negative for dysphoric mood. The patient is not nervous/anxious.      Objective:   BP 110/70 (BP Location: Left Arm, Patient Position: Sitting, Cuff Size: Normal)    Pulse 64    Resp 12    Ht 5\' 1"  (1.549 m)    Wt 137 lb (62.1 kg)    LMP  (LMP Unknown)    BMI 25.89 kg/m    Physical Exam Constitutional:      Appearance: Normal appearance.  HENT:     Head: Normocephalic and atraumatic.     Right Ear: Tympanic membrane, ear canal and external ear normal.     Left Ear: Tympanic membrane, ear canal and external ear normal.     Nose: Nose normal.     Mouth/Throat:  Mouth: Mucous membranes are dry.     Pharynx: Oropharynx is clear.  Eyes:     Extraocular Movements: Extraocular movements intact.     Conjunctiva/sclera: Conjunctivae normal.     Pupils: Pupils are equal, round, and reactive to light.     Comments: Unable to see discs well as pupils small   Neck:     Thyroid: No thyroid mass or thyromegaly.  Cardiovascular:     Rate and Rhythm: Normal rate and regular rhythm.     Heart sounds: S1 normal and S2 normal. No murmur heard.   No friction rub. No S3 or S4 sounds.     Comments: No carotid bruits.  Carotid, radial, femoral, DP and PT pulses normal and equal.    Pulmonary:     Effort: Pulmonary effort is normal.     Breath sounds: Normal breath sounds.  Chest:  Breasts:    Right: No inverted nipple, mass, nipple discharge or skin change.     Left: No inverted nipple, mass, nipple discharge or skin change.  Abdominal:     General: Bowel sounds are normal.     Palpations: Abdomen is soft. There is no hepatomegaly, splenomegaly or mass.     Tenderness: There is no abdominal tenderness.     Hernia: No hernia is present.  Genitourinary:    Comments: Normal external female genitalia No uterine or adnexal mass or tenderness. No vaginal discharge. Musculoskeletal:        General: Normal range of motion.     Cervical back: Normal range of motion and neck supple.     Right lower leg: No edema.     Left lower leg: No edema.     Comments: Mild upper thoracic kyphosis  Feet:     Comments: Right great toe with mild resolving contusion and swelling.  No erythema.  Full ROM.  No crepitation, though mildly tender on palpation.  No  disfiguration.  Medial left great toe with thickened irregular and crusty callus.   Lymphadenopathy:     Head:     Right side of head: No submental or submandibular adenopathy.     Left side of head: No submental or submandibular adenopathy.     Cervical: No cervical adenopathy.     Upper Body:     Right upper body: No supraclavicular or axillary adenopathy.     Left upper body: No supraclavicular or axillary adenopathy.     Lower Body: No right inguinal adenopathy. No left inguinal adenopathy.  Skin:    General: Skin is warm.     Capillary Refill: Capillary refill takes less than 2 seconds.     Findings: No rash.  Neurological:     General: No focal deficit present.     Mental Status: She is alert and oriented to person, place, and time.     Assessment & Plan    CPE without pap FIT test to return in 2 weeks. Shingrix #2 Moderna COVId bivalent.   2.  Foot callus:  Left great medial toe.  Referral to Podiatry  3.  Stubbed right great toe:  appears to be healing.  To call if problems with pain/function.  May take ibuprofen 400-600 mg with meals twice daily for 10 days for swelling.  Wear protective shoes with wide toe box.  4.  Osteopenia:  cannot afford follow up DEXA bone density.  Check Vitamin D level with upcoming fasting labs.  Discussed adequate calcium and Vitamin D intake and regular physical activity.  Patient already with GERD, so would like to document if into osteoporosis before considering parenteral medication for osteoporosis as would likely not tolerate oral bisphosphonates  5.  Prediabetes, elevated cholesterol:  fasting labs in next 2 weeks.

## 2021-12-08 ENCOUNTER — Other Ambulatory Visit: Payer: Self-pay

## 2021-12-08 ENCOUNTER — Encounter: Payer: Self-pay | Admitting: Podiatry

## 2021-12-08 ENCOUNTER — Ambulatory Visit (INDEPENDENT_AMBULATORY_CARE_PROVIDER_SITE_OTHER): Payer: Self-pay | Admitting: Podiatry

## 2021-12-08 DIAGNOSIS — L84 Corns and callosities: Secondary | ICD-10-CM

## 2021-12-08 NOTE — Progress Notes (Signed)
?  Subjective:  ?Patient ID: Ann Chase, female    DOB: [birth year],   MRN: [id number] ? ?Chief Complaint  ?Patient presents with  ? Callouses  ?   ?Left foot callus  ? ? ?71 y.o. female presents for concern of left foot and right foot calluses that have been going on for years. She relates constant pain when pressure is applied and difficulty walking. She is pre-diabetic. Here today with interpreter . Denies any other pedal complaints. Denies n/v/f/c.  ? ?Past Medical History:  ?Diagnosis Date  ? Arthritis   ? GERD (gastroesophageal reflux disease)   ? Hyperlipidemia   ? Lumbar radiculopathy, right 01/26/2017  ? Osteopenia   ? Prediabetes 2013  ? A1C as high as 6.0%.Weighed 20 lbs more at the time  ? Tongue disorder   ? Not clear if an organic process  ? ? ?Objective:  ?Physical Exam: ?Vascular: DP/PT pulses 2/4 bilateral. CFT <3 seconds. Normal hair growth on digits. No edema.  ?Skin. No lacerations or abrasions bilateral feet. Hyperkeratotic lesion sub second and 4th metatarsals on left as well as medial hallux on left. And right medial hallux and sub fifth metatarsal  ?Musculoskeletal: MMT 5/5 bilateral lower extremities in DF, PF, Inversion and Eversion. Deceased ROM in DF of ankle joint.  ?Neurological: Sensation intact to light touch.  ? ?Assessment:  ? ?1. Corns and callosities   ? ? ? ?Plan:  ?Patient was evaluated and treated and all questions answered. ?-Discussed corns and calluses with patient and treatment options.  ?-Hyperkeratotic tissue was debrided with chisel without incident.  ?-Encouraged daily moisturizing ?-Discussed use of pumice stone ?-Advised good supportive shoes and inserts ?-Patient to return to office as needed or sooner if condition worsens. ? ? ?Louann Sjogren, DPM  ? ? ?

## 2021-12-15 ENCOUNTER — Other Ambulatory Visit: Payer: Self-pay

## 2021-12-15 DIAGNOSIS — R7303 Prediabetes: Secondary | ICD-10-CM

## 2021-12-15 DIAGNOSIS — M8580 Other specified disorders of bone density and structure, unspecified site: Secondary | ICD-10-CM

## 2021-12-15 DIAGNOSIS — Z1211 Encounter for screening for malignant neoplasm of colon: Secondary | ICD-10-CM

## 2021-12-15 DIAGNOSIS — E7800 Pure hypercholesterolemia, unspecified: Secondary | ICD-10-CM

## 2021-12-16 LAB — COMPREHENSIVE METABOLIC PANEL
ALT: 15 IU/L (ref 0–32)
AST: 21 IU/L (ref 0–40)
Albumin/Globulin Ratio: 1.7 (ref 1.2–2.2)
Albumin: 4.5 g/dL (ref 3.8–4.8)
Alkaline Phosphatase: 71 IU/L (ref 44–121)
BUN/Creatinine Ratio: 25 (ref 12–28)
BUN: 17 mg/dL (ref 8–27)
Bilirubin Total: 0.4 mg/dL (ref 0.0–1.2)
CO2: 23 mmol/L (ref 20–29)
Calcium: 9.4 mg/dL (ref 8.7–10.3)
Chloride: 102 mmol/L (ref 96–106)
Creatinine, Ser: 0.67 mg/dL (ref 0.57–1.00)
Globulin, Total: 2.7 g/dL (ref 1.5–4.5)
Glucose: 98 mg/dL (ref 70–99)
Potassium: 4.1 mmol/L (ref 3.5–5.2)
Sodium: 139 mmol/L (ref 134–144)
Total Protein: 7.2 g/dL (ref 6.0–8.5)
eGFR: 95 mL/min/{1.73_m2} (ref >59)

## 2021-12-16 LAB — CBC WITH DIFFERENTIAL/PLATELET
Basophils Absolute: 0 10*3/uL (ref 0.0–0.2)
Basos: 1 %
EOS (ABSOLUTE): 0.1 10*3/uL (ref 0.0–0.4)
Eos: 3 %
Hematocrit: 38.7 % (ref 34.0–46.6)
Hemoglobin: 13 g/dL (ref 11.1–15.9)
Immature Grans (Abs): 0 10*3/uL (ref 0.0–0.1)
Immature Granulocytes: 0 %
Lymphocytes Absolute: 1.5 10*3/uL (ref 0.7–3.1)
Lymphs: 28 %
MCH: 29.1 pg (ref 26.6–33.0)
MCHC: 33.6 g/dL (ref 31.5–35.7)
MCV: 87 fL (ref 79–97)
Monocytes Absolute: 0.4 10*3/uL (ref 0.1–0.9)
Monocytes: 7 %
Neutrophils Absolute: 3.4 10*3/uL (ref 1.4–7.0)
Neutrophils: 61 %
Platelets: 239 10*3/uL (ref 150–450)
RBC: 4.46 x10E6/uL (ref 3.77–5.28)
RDW: 13.4 % (ref 11.7–15.4)
WBC: 5.5 10*3/uL (ref 3.4–10.8)

## 2021-12-16 LAB — LIPID PANEL W/O CHOL/HDL RATIO
Cholesterol, Total: 196 mg/dL (ref 100–199)
HDL: 63 mg/dL (ref >39)
LDL Chol Calc (NIH): 114 mg/dL — ABNORMAL HIGH (ref 0–99)
Triglycerides: 108 mg/dL (ref 0–149)
VLDL Cholesterol Cal: 19 mg/dL (ref 5–40)

## 2021-12-16 LAB — HEMOGLOBIN A1C
Est. average glucose Bld gHb Est-mCnc: 123 mg/dL
Hgb A1c MFr Bld: 5.9 % — ABNORMAL HIGH (ref 4.8–5.6)

## 2021-12-16 LAB — VITAMIN D 25 HYDROXY (VIT D DEFICIENCY, FRACTURES): Vit D, 25-Hydroxy: 34.2 ng/mL (ref 30.0–100.0)

## 2022-02-17 ENCOUNTER — Encounter: Payer: Self-pay | Admitting: Internal Medicine

## 2022-02-17 ENCOUNTER — Ambulatory Visit: Payer: Self-pay | Admitting: Internal Medicine

## 2022-02-17 VITALS — BP 116/60 | HR 76 | Resp 12 | Ht 61.0 in | Wt 139.0 lb

## 2022-02-17 DIAGNOSIS — M546 Pain in thoracic spine: Secondary | ICD-10-CM

## 2022-02-17 DIAGNOSIS — M25511 Pain in right shoulder: Secondary | ICD-10-CM

## 2022-02-17 DIAGNOSIS — M542 Cervicalgia: Secondary | ICD-10-CM

## 2022-02-17 NOTE — Patient Instructions (Signed)
[facility] [address] [place], [place] [address]  [phone] Hours of Operation Mondays to Thursdays: 8 am to 8 pm Fridays: 9 am to 8 pm Saturdays: 9 am to 1 pm Sundays: Closed  

## 2022-02-17 NOTE — Progress Notes (Signed)
       Subjective:    Patient ID: Ann Chase, female   DOB: 1951-08-10, 71 y.o.   MRN: [id number]   HPI  Tildon Husky interprets  Right shoulder and upper back pain:  Has been seen at [facility] PT clinic from February to April  weekly.  She does feel this helped her pain and movement.   Now she has more of a burning sensation and grabs her upper left shoulder blade area.  She states her shoulder felt better when she was actively going to PT as the massage performed seemed to dissolve the discomfort.  She states would not have the discomfort for 2 weeks.  She is performing the home exercise program she was given.   Describes what felt like an electric shock in her upperback and lateral right thorax that essentially resolved with PT/massage in particular.  Now just with light burn in same areas at times.    Current Meds  Medication Sig   Calcium Carbonate-Vitamin D 600-400 MG-UNIT tablet Take 1 tablet by mouth 2 (two) times daily. Reported on 12/03/2015   ibuprofen (ADVIL) 200 MG tablet Take 1 tablet (200 mg total) by mouth every 6 (six) hours as needed.   Multiple Vitamins-Minerals (MULTIVITAMIN WITH MINERALS) tablet Take 1 tablet by mouth daily. Reported on 12/03/2015   pantoprazole (PROTONIX) 40 MG tablet 1 tab by mouth in the morning 30 minutes before breakfast   Propylene Glycol (SYSTANE COMPLETE OP) Apply 1 drop to eye 2 (two) times daily.   No Known Allergies   Review of Systems    Objective:   BP 116/60 (BP Location: Left Arm, Patient Position: Sitting, Cuff Size: Normal)   Pulse 76   Resp 12   Ht 5\' 1"  (1.549 m)   Wt 139 lb (63 kg)   LMP  (LMP Unknown)   BMI 26.26 kg/m   Physical Exam Tender along serratus on right mid and upper thoraci back.  Tender across right trap Tender across AC/CC joint.  Assessment & Plan  Exercises twice daily instead of once. Gave stretches can use with weights Stretches through doorway. Low cost masage for  shoulder/chest Water exercises at [facility]

## 2022-06-01 ENCOUNTER — Ambulatory Visit: Payer: Self-pay | Admitting: Internal Medicine

## 2022-06-01 ENCOUNTER — Encounter: Payer: Self-pay | Admitting: Internal Medicine

## 2022-06-01 VITALS — BP 138/78 | HR 68 | Resp 12 | Ht 61.0 in | Wt 137.0 lb

## 2022-06-01 DIAGNOSIS — M546 Pain in thoracic spine: Secondary | ICD-10-CM

## 2022-06-01 DIAGNOSIS — B079 Viral wart, unspecified: Secondary | ICD-10-CM

## 2022-06-01 DIAGNOSIS — J302 Other seasonal allergic rhinitis: Secondary | ICD-10-CM

## 2022-06-01 DIAGNOSIS — M25511 Pain in right shoulder: Secondary | ICD-10-CM

## 2022-06-01 DIAGNOSIS — M542 Cervicalgia: Secondary | ICD-10-CM

## 2022-06-01 MED ORDER — LORATADINE 10 MG PO TABS: 10.0000 mg | ORAL_TABLET | Freq: Every day | ORAL | 11 refills | Status: AC

## 2022-06-01 NOTE — Patient Instructions (Addendum)
Schedule for flu vaccine clinic  For Wart:  Mediplast 40% salicylic acid--cut to fit the wart.   Apply new mediplast every 24 hours or if get wet, change immediately.

## 2022-06-01 NOTE — Progress Notes (Signed)
    Subjective:    Patient ID: Ann Chase, female   DOB: Sep 15, 1951, 71 y.o.   MRN: [id number]   HPI  Ann Chase   Right shoulder and chest wall pain:  still does stretch exercises everyday.  Took about 1 week after she started them in June for the pain to resolve.    2.  Right ear pain:  Started about 3 months on and off.  When she has it--is on and off throughout a day.  Feels her equilibrium is off with the pain.  She does not seem to have associated hearing loss with these episodes, though in general, feels people have to speak to her louder at times.    3.  Congestion:  Problem for 4 days, though also a recurrent issue over past 2 months.  No sneezing, runny nose.  Does not feel she has itchy or watery eyes, nose or throat.  Feels she has phlegm in her throat--the latter is constant.  Does not feel she has had these symptoms this time of year in the past.  No fever.  Has not tried anything besides drinking more water.    Current Meds  Medication Sig   ibuprofen (ADVIL) 200 MG tablet Take 1 tablet (200 mg total) by mouth every 6 (six) hours as needed.   Multiple Vitamins-Minerals (MULTIVITAMIN WITH MINERALS) tablet Take 1 tablet by mouth daily. Reported on 12/03/2015   pantoprazole (PROTONIX) 40 MG tablet 1 tab by mouth in the morning 30 minutes before breakfast   Propylene Glycol (SYSTANE COMPLETE OP) Apply 1 drop to eye 2 (two) times daily.   No Known Allergies   Review of Systems    Objective:   BP 138/78 (BP Location: Left Arm, Patient Position: Sitting, Cuff Size: Normal)   Pulse 68   Resp 12   Ht 5\' 1"  (1.549 m)   Wt 137 lb (62.1 kg)   LMP  (LMP Unknown)   BMI 25.89 kg/m   Physical Exam Denies symptoms from ear today. NAD HEENT:  PERRL, EOMI, pupils 2 mm bilaterally.  TMs pearly gray and perhaps a bit retracted, but without erythema or thickening.  Throat without injection or exudate Neck:  Supple, No adenopathy Chest:  CTA CV:  RRR without  murmur or rub.   MS: Good ROM, neck, shoulder, and trunk, without significant pain Wart, side of left index finger.   Assessment & Plan   Seasonal allergies:  Loratadine 10 mg daily.  Neti pot daily.  2.  Neck, Right shoulder and chest wall pain:  improved  3.  Wart of left index finger:  Mediplast.  Discussed how to use.  May use up to 12 weeks to remove wart.    4.  HM:  schedule for influenza vaccine clinic.

## 2022-06-04 ENCOUNTER — Other Ambulatory Visit: Payer: Self-pay | Admitting: Internal Medicine

## 2022-11-29 ENCOUNTER — Other Ambulatory Visit: Payer: Self-pay

## 2022-11-29 DIAGNOSIS — E7800 Pure hypercholesterolemia, unspecified: Secondary | ICD-10-CM

## 2022-11-29 DIAGNOSIS — Z79899 Other long term (current) drug therapy: Secondary | ICD-10-CM

## 2022-11-29 DIAGNOSIS — R7303 Prediabetes: Secondary | ICD-10-CM

## 2022-11-30 LAB — COMPREHENSIVE METABOLIC PANEL
ALT: 14 IU/L (ref 0–32)
AST: 18 IU/L (ref 0–40)
Albumin/Globulin Ratio: 1.7 (ref 1.2–2.2)
Albumin: 4.5 g/dL (ref 3.9–4.9)
Alkaline Phosphatase: 67 IU/L (ref 44–121)
BUN/Creatinine Ratio: 18 (ref 12–28)
BUN: 15 mg/dL (ref 8–27)
Bilirubin Total: 0.5 mg/dL (ref 0.0–1.2)
CO2: 22 mmol/L (ref 20–29)
Calcium: 9.3 mg/dL (ref 8.7–10.3)
Chloride: 106 mmol/L (ref 96–106)
Creatinine, Ser: 0.84 mg/dL (ref 0.57–1.00)
Globulin, Total: 2.7 g/dL (ref 1.5–4.5)
Glucose: 101 mg/dL — ABNORMAL HIGH (ref 70–99)
Potassium: 4.1 mmol/L (ref 3.5–5.2)
Sodium: 143 mmol/L (ref 134–144)
Total Protein: 7.2 g/dL (ref 6.0–8.5)
eGFR: 75 mL/min/{1.73_m2} (ref >59)

## 2022-11-30 LAB — CBC WITH DIFFERENTIAL/PLATELET
Basophils Absolute: 0 10*3/uL (ref 0.0–0.2)
Basos: 1 %
EOS (ABSOLUTE): 0.1 10*3/uL (ref 0.0–0.4)
Eos: 2 %
Hematocrit: 40 % (ref 34.0–46.6)
Hemoglobin: 13 g/dL (ref 11.1–15.9)
Immature Grans (Abs): 0 10*3/uL (ref 0.0–0.1)
Immature Granulocytes: 0 %
Lymphocytes Absolute: 1.1 10*3/uL (ref 0.7–3.1)
Lymphs: 20 %
MCH: 29 pg (ref 26.6–33.0)
MCHC: 32.5 g/dL (ref 31.5–35.7)
MCV: 89 fL (ref 79–97)
Monocytes Absolute: 0.4 10*3/uL (ref 0.1–0.9)
Monocytes: 7 %
Neutrophils Absolute: 3.9 10*3/uL (ref 1.4–7.0)
Neutrophils: 70 %
Platelets: 238 10*3/uL (ref 150–450)
RBC: 4.49 x10E6/uL (ref 3.77–5.28)
RDW: 13.3 % (ref 11.7–15.4)
WBC: 5.5 10*3/uL (ref 3.4–10.8)

## 2022-11-30 LAB — LIPID PANEL W/O CHOL/HDL RATIO
Cholesterol, Total: 194 mg/dL (ref 100–199)
HDL: 58 mg/dL (ref >39)
LDL Chol Calc (NIH): 111 mg/dL — ABNORMAL HIGH (ref 0–99)
Triglycerides: 141 mg/dL (ref 0–149)
VLDL Cholesterol Cal: 25 mg/dL (ref 5–40)

## 2022-11-30 LAB — HEMOGLOBIN A1C
Est. average glucose Bld gHb Est-mCnc: 134 mg/dL
Hgb A1c MFr Bld: 6.3 % — ABNORMAL HIGH (ref 4.8–5.6)

## 2022-12-06 ENCOUNTER — Encounter: Payer: Self-pay | Admitting: Internal Medicine

## 2022-12-06 ENCOUNTER — Ambulatory Visit: Payer: Self-pay | Admitting: Internal Medicine

## 2022-12-06 VITALS — BP 128/74 | HR 64 | Resp 12 | Ht 61.0 in | Wt 138.0 lb

## 2022-12-06 DIAGNOSIS — Z0000 Encounter for general adult medical examination without abnormal findings: Secondary | ICD-10-CM

## 2022-12-06 DIAGNOSIS — Z1231 Encounter for screening mammogram for malignant neoplasm of breast: Secondary | ICD-10-CM

## 2022-12-06 DIAGNOSIS — Z23 Encounter for immunization: Secondary | ICD-10-CM

## 2022-12-06 DIAGNOSIS — B351 Tinea unguium: Secondary | ICD-10-CM

## 2022-12-06 DIAGNOSIS — R350 Frequency of micturition: Secondary | ICD-10-CM

## 2022-12-06 DIAGNOSIS — R7303 Prediabetes: Secondary | ICD-10-CM

## 2022-12-06 LAB — POCT URINALYSIS DIPSTICK
Bilirubin, UA: NEGATIVE
Glucose, UA: NEGATIVE
Ketones, UA: NEGATIVE
Leukocytes, UA: NEGATIVE
Nitrite, UA: NEGATIVE
Protein, UA: NEGATIVE
Spec Grav, UA: 1.015 (ref 1.010–1.025)
Urobilinogen, UA: 0.2 E.U./dL
pH, UA: 6.5 (ref 5.0–8.0)

## 2022-12-06 MED ORDER — TERBINAFINE HCL 250 MG PO TABS: ORAL_TABLET | ORAL | 0 refills | Status: DC

## 2022-12-06 NOTE — Progress Notes (Signed)
Subjective:    Patient ID: Ann Chase, female   DOB: January 10, 1951, 72 y.o.   MRN: [id number]   HPI  Ann Chase interprets  CPE with pap  1.  Pap:  Last performed 07/2019 and Benign reparative changes with atrophy.  No paps since.  No sexually active.    2.  Mammogram:  Last 08/2021.  Normal.  She has not reappointed.  States has had difficulty getting in with her work schedule.  No family history of breast cancer.    3.  Osteoprevention:  Taking Calcium and Vitamin D twice daily.  Is physically active daily--walks for an hour if not working.  Also on feet working regularly.    4.  Guaiac Cards/FIT: Last performed in 2022 and negative.    5.  Colonoscopy:  History in perhaps 2015.  States was normal.  She keeps forgetting to bring the papers in.  No family history of colon cancer.    6.  Immunizations:  Has not had recent COVID vaccine.    Immunization History  Administered Date(s) Administered   Influenza Inj Mdck Quad Pf 08/01/2019   Moderna Covid-19 Vaccine Bivalent Booster 44yrs & up 11/24/2021   Moderna Sars-Covid-2 Vaccination 11/11/2019, 12/10/2019, 07/20/2020   Pneumococcal Conjugate-13 08/01/2019   Pneumococcal Polysaccharide-23 08/07/2020   Tdap 06/30/2016   Zoster Recombinat (Shingrix) 05/26/2021, 11/24/2021     7.  Glucose/Cholesterol:  A1C up to 6.3% recently.  Last year was at 5.9%.  Cholesterol high normal, but currently acceptable.   Lipid Panel     Component Value Date/Time   CHOL 194 11/29/2022 0902   TRIG 141 11/29/2022 0902   HDL 58 11/29/2022 0902   CHOLHDL 3.7 Ratio 01/21/2010 1836   VLDL 28 01/21/2010 1836   LDLCALC 111 (H) 11/29/2022 0902   LABVLDL 25 11/29/2022 0902     Current Meds  Medication Sig   Calcium Carbonate-Vitamin D 600-400 MG-UNIT tablet Take 1 tablet by mouth 2 (two) times daily. Reported on 12/03/2015   loratadine (CLARITIN) 10 MG tablet Take 1 tablet (10 mg total) by mouth daily.   Multiple  Vitamins-Minerals (MULTIVITAMIN WITH MINERALS) tablet Take 1 tablet by mouth daily. Reported on 12/03/2015   pantoprazole (PROTONIX) 40 MG tablet TAKE ONE TABLET BY MOUTH DAILY IN THE MORNING 30 MINUTES BEFORE BREAKFAST   Propylene Glycol (SYSTANE COMPLETE OP) Apply 1 drop to eye 2 (two) times daily.    No Known Allergies  Past Medical History:  Diagnosis Date   Arthritis    GERD (gastroesophageal reflux disease)    Hyperlipidemia    Lumbar radiculopathy, right 01/26/2017   Osteopenia    Prediabetes 2013   A1C as high as 6.0%.Weighed 20 lbs more at the time   Tongue disorder    Not clear if an organic process   Past Surgical History:  Procedure Laterality Date   BREAST BIOPSY Right 04/02/2004   LIPOMA EXCISION N/A 1985   Family History  Problem Relation Age of Onset   Cirrhosis Father    Hepatitis Father        Unknown viral etiology   Osteoporosis Sister    Benign prostatic hyperplasia Brother    Hyperlipidemia Son    Diabetes Son    Hypertension Son    Obesity Son    Liver disease Son        History of elevated Tbil without pain.  Resolved   Breast cancer Neg Hx    Social History   Socioeconomic History  Marital status: Legally Separated    Spouse name: Not on file   Number of children: 3   Years of education: 8   Highest education level: Not on file  Occupational History   Occupation: Hospital doctor    Comment: part time- Does not wear gloves when washing with harsh chemicals.   Occupation: Leisure centre manager  Tobacco Use   Smoking status: Never    Passive exposure: Never   Smokeless tobacco: Never  Vaping Use   Vaping Use: Never used  Substance and Sexual Activity   Alcohol use: No   Drug use: No   Sexual activity: Not Currently  Other Topics Concern   Not on file  Social History Narrative   One son (middle son) lives near her   She lives alone in her own home.   No friends live nearby--her friendships are with her coworkers   Her  son's family has made sure she has food and money during her nonworking time of the pandemic.     No current needs.      Social Determinants of Health   Financial Resource Strain: Low Risk  (12/06/2022)   Overall Financial Resource Strain (CARDIA)    Difficulty of Paying Living Expenses: Not hard at all  Food Insecurity: No Food Insecurity (02/09/2023)   Hunger Vital Sign    Worried About Running Out of Food in the Last Year: Never true    Ran Out of Food in the Last Year: Never true  Transportation Needs: No Transportation Needs (02/09/2023)   PRAPARE - Administrator, Civil Service (Medical): No    Lack of Transportation (Non-Medical): No  Physical Activity: Not on file  Stress: Not on file  Social Connections: Unknown (08/01/2019)   Social Connection and Isolation Panel [NHANES]    Frequency of Communication with Friends and Family: Not on file    Frequency of Social Gatherings with Friends and Family: Not on file    Attends Religious Services: Not on file    Active Member of Clubs or Organizations: Not on file    Attends Banker Meetings: Not on file    Marital Status: Separated  Intimate Partner Violence: Not At Risk (12/06/2022)   Humiliation, Afraid, Rape, and Kick questionnaire    Fear of Current or Ex-Partner: No    Emotionally Abused: No    Physically Abused: No    Sexually Abused: No      Review of Systems  HENT:         Dry mouth  Genitourinary:  Positive for frequency (For 15 days.  No dysuria.  No back pain or fever.).  Skin:        Feet feel wet especially after on feet a lot.   Also with toenail fungus--Was prescribed Terbinafine back in 2018 -not clear she took the medication.  She did not follow up with that Podiatry provider.      Objective:   BP 128/74 (BP Location: Right Arm, Patient Position: Sitting, Cuff Size: Normal)   Pulse 64   Resp 12   Ht 5\' 1"  (1.549 m)   Wt 138 lb (62.6 kg)   LMP  (LMP Unknown)   BMI 26.07  kg/m   Physical Exam HENT:     Head: Normocephalic and atraumatic.     Right Ear: Tympanic membrane, ear canal and external ear normal.     Left Ear: Tympanic membrane, ear canal and external ear normal.  Nose: Nose normal.     Mouth/Throat:     Mouth: Mucous membranes are dry.     Pharynx: Oropharynx is clear.  Eyes:     Extraocular Movements: Extraocular movements intact.     Pupils: Pupils are equal, round, and reactive to light.     Comments: Discs sharp  Neck:     Thyroid: No thyroid mass or thyromegaly.  Cardiovascular:     Rate and Rhythm: Normal rate and regular rhythm.     Heart sounds: S1 normal and S2 normal. No murmur heard.    No friction rub. No S3 or S4 sounds.     Comments: No carotid bruits.  Carotid, radial, femoral, DP and PT pulses normal and equal.   Pulmonary:     Effort: Pulmonary effort is normal.     Breath sounds: Normal breath sounds and air entry.  Chest:  Breasts:    Right: No inverted nipple, mass or nipple discharge.     Left: No inverted nipple, mass or nipple discharge.  Abdominal:     General: Bowel sounds are normal.     Palpations: Abdomen is soft. There is no hepatomegaly, splenomegaly or mass.     Tenderness: There is no abdominal tenderness.     Hernia: No hernia is present.  Genitourinary:    Comments: Normal external female genitalia No uterine or adnexal mass or tenderness. Musculoskeletal:        General: Normal range of motion.     Cervical back: Normal range of motion and neck supple.     Right lower leg: No edema.     Left lower leg: No edema.  Feet:     Comments: Great toenail on left with severe thickening and pincer deformity.  2 smaller nails bilaterally Lymphadenopathy:     Head:     Right side of head: No submental or submandibular adenopathy.     Left side of head: No submental or submandibular adenopathy.     Cervical: No cervical adenopathy.     Upper Body:     Right upper body: No supraclavicular or  axillary adenopathy.     Left upper body: No supraclavicular or axillary adenopathy.     Lower Body: No right inguinal adenopathy. No left inguinal adenopathy.  Skin:    General: Skin is warm.     Capillary Refill: Capillary refill takes less than 2 seconds.     Findings: No rash.  Neurological:     General: No focal deficit present.     Mental Status: She is alert and oriented to person, place, and time.     Cranial Nerves: Cranial nerves 2-12 are intact.     Sensory: Sensation is intact.     Motor: Motor function is intact.     Coordination: Coordination is intact.     Gait: Gait is intact.     Deep Tendon Reflexes: Reflexes are normal and symmetric.  Psychiatric:        Behavior: Behavior normal. Behavior is cooperative.      Assessment & Plan   CPE FIT to return in 2 weeks. Mammogram ordered. Spikevax CBC and CMP okay save for glucose.    2.  Prediabetes:  A1C close to diabetic range.  She feels she can improve her diet.    3.  Urinary frequency:  UA and urine culture  4.  Onychomycosis of toenails:  Terbinainfe 250 mg daily for 84 days.  Follow up in 6 and 12 weeks with hepatic  profiles.

## 2022-12-08 LAB — URINE CULTURE

## 2022-12-14 ENCOUNTER — Other Ambulatory Visit: Payer: Self-pay

## 2022-12-14 DIAGNOSIS — Z1211 Encounter for screening for malignant neoplasm of colon: Secondary | ICD-10-CM

## 2022-12-14 LAB — POC FIT TEST STOOL: Fecal Occult Blood: NEGATIVE

## 2022-12-22 ENCOUNTER — Other Ambulatory Visit: Payer: Self-pay

## 2022-12-22 DIAGNOSIS — Z1231 Encounter for screening mammogram for malignant neoplasm of breast: Secondary | ICD-10-CM

## 2023-01-30 ENCOUNTER — Ambulatory Visit: Payer: Self-pay | Admitting: Internal Medicine

## 2023-02-09 ENCOUNTER — Ambulatory Visit: Payer: Self-pay | Admitting: Hematology and Oncology

## 2023-02-09 ENCOUNTER — Ambulatory Visit
Admission: RE | Admit: 2023-02-09 | Discharge: 2023-02-09 | Disposition: A | Discharge status: Home / Self Care | Payer: No Typology Code available for payment source | Source: Ambulatory Visit | Attending: Obstetrics and Gynecology | Admitting: Obstetrics and Gynecology

## 2023-02-09 ENCOUNTER — Ambulatory Visit: Payer: No Typology Code available for payment source

## 2023-02-09 VITALS — Wt 141.8 lb

## 2023-02-09 DIAGNOSIS — Z1231 Encounter for screening mammogram for malignant neoplasm of breast: Secondary | ICD-10-CM

## 2023-02-09 NOTE — Patient Instructions (Signed)
Taught Ann Chase about self breast awareness and gave educational materials to take home. Patient did not need a Pap smear today due to last Pap smear was in 2020 per patient.  She no longer needs Pap smears as patient is 72 yo.  Referred patient to the [facility] for screening mammogram. Appointment scheduled for 02/09/23. Patient aware of appointment and will be there. Let patient know will follow up with her within the next couple weeks with results. Ann Chase verbalized understanding.  Pascal Lux, NP 2:20 PM

## 2023-02-09 NOTE — Progress Notes (Signed)
Ann Chase is a 72 y.o. female who presents to [facility] clinic today with no complaints.    Pap Smear: Pap not smear completed today. Last Pap smear was 08/19/2019 and was normal. Per patient has no history of an abnormal Pap smear. Last Pap smear result is available in Epic. She is no longer due Pap smears due to age.    Physical exam: Breasts Breasts symmetrical. No skin abnormalities bilateral breasts. No nipple retraction bilateral breasts. No nipple discharge bilateral breasts. No lymphadenopathy. No lumps palpated bilateral breasts.    MM 3D SCREEN BREAST BILATERAL  Result Date: 09/01/2021 CLINICAL DATA:  Screening. EXAM: DIGITAL SCREENING BILATERAL MAMMOGRAM WITH TOMOSYNTHESIS AND CAD TECHNIQUE: Bilateral screening digital craniocaudal and mediolateral oblique mammograms were obtained. Bilateral screening digital breast tomosynthesis was performed. The images were evaluated with computer-aided detection. COMPARISON:  Previous exam(s). ACR Breast Density Category b: There are scattered areas of fibroglandular density. FINDINGS: There are no findings suspicious for malignancy. IMPRESSION: No mammographic evidence of malignancy. A result letter of this screening mammogram will be mailed directly to the patient. RECOMMENDATION: Screening mammogram in one year. (Code:SM-B-01Y) BI-RADS CATEGORY  1: Negative. Electronically Signed   By: Bary Richard M.D.   On: 09/01/2021 11:17   MS DIGITAL SCREENING TOMO BILATERAL  Result Date: 09/04/2020 CLINICAL DATA:  Screening. EXAM: DIGITAL SCREENING BILATERAL MAMMOGRAM WITH TOMO AND CAD COMPARISON:  Previous exam(s). ACR Breast Density Category b: There are scattered areas of fibroglandular density. FINDINGS: There are no findings suspicious for malignancy. Images were processed with CAD. IMPRESSION: No mammographic evidence of malignancy. A result letter of this screening mammogram will be mailed directly to the patient. RECOMMENDATION:  Screening mammogram in one year. (Code:SM-B-01Y) BI-RADS CATEGORY  1: Negative. Electronically Signed   By: Amie Portland M.D.   On: 09/04/2020 07:29   MS DIGITAL DIAG TOMO BILAT  Result Date: 09/14/2018 CLINICAL DATA:  72 year old female with a provider palpated right breast lump. EXAM: DIGITAL DIAGNOSTIC BILATERAL MAMMOGRAM WITH CAD AND TOMO ULTRASOUND RIGHT BREAST COMPARISON:  Previous exam(s). ACR Breast Density Category b: There are scattered areas of fibroglandular density. FINDINGS: Radiopaque BB was placed at the site of the focal lump in the upper-outer right breast. No suspicious mammographic findings are seen deep to the radiopaque BB. No new or suspicious findings are identified in either breast. The parenchymal pattern is stable. Mammographic images were processed with CAD. On physical exam, I palpate no suspicious lumps in the upper outer right breast. Targeted ultrasound is performed, showing normal fibroglandular tissue without focal or suspicious sonographic abnormality. IMPRESSION: 1. No mammographic evidence of malignancy in either breast. 2. No suspicious sonographic findings at the site of the patient's focal right breast lump. RECOMMENDATION: 1. Clinical follow-up recommended for the palpable area of concern in the right breast. Any further workup should be based on clinical grounds. 2.  Screening mammogram in one year.(Code:SM-B-01Y) I have discussed the findings and recommendations with the patient. Results were also provided in writing at the conclusion of the visit. If applicable, a reminder letter will be sent to the patient regarding the next appointment. BI-RADS CATEGORY  1: Negative. Electronically Signed   By: Sande Brothers M.D.   On: 09/14/2018 14:46       Pelvic/Bimanual Pap is not indicated today    Smoking History: Patient has never smoked and was not referred to quit line.    Patient Navigation: Patient education provided. Access to services provided for patient  through BCCCP program. Byrd Hesselbach  [place] interpreter provided. No transportation provided   Colorectal Cancer Screening: Per patient has never had colonoscopy completed No complaints today.    Breast and Cervical Cancer Risk Assessment: Patient does not have family history of breast cancer, known genetic mutations, or radiation treatment to the chest before age 50. Patient does not have history of cervical dysplasia, immunocompromised, or DES exposure in-utero.  Risk Scores as of 02/09/2023     Ann Chase           5-year 1.46 %   Lifetime 4.54 %   This patient is [place]/Lat[place] but has no documented birth country, so the [place] model used data from [place] patients to calculate their risk score. Document a birth country in the Demographics activity for a more accurate score.         Last calculated by Caprice Red, CMA on 02/09/2023 at  1:41 PM        A: BCCCP exam without pap smear No complaints with benign exam.   P: Referred patient to the [facility] for a screening mammogram. Appointment scheduled 02/09/23.  Pascal Lux, NP 02/09/2023 2:17 PM

## 2023-02-28 ENCOUNTER — Ambulatory Visit: Payer: Self-pay | Admitting: Internal Medicine

## 2023-02-28 ENCOUNTER — Encounter: Payer: Self-pay | Admitting: Internal Medicine

## 2023-02-28 VITALS — BP 138/84 | HR 60 | Resp 16 | Ht 61.0 in | Wt 143.0 lb

## 2023-02-28 DIAGNOSIS — Z79899 Other long term (current) drug therapy: Secondary | ICD-10-CM

## 2023-02-28 DIAGNOSIS — M2041 Other hammer toe(s) (acquired), right foot: Secondary | ICD-10-CM

## 2023-02-28 DIAGNOSIS — B351 Tinea unguium: Secondary | ICD-10-CM

## 2023-02-28 DIAGNOSIS — M2042 Other hammer toe(s) (acquired), left foot: Secondary | ICD-10-CM

## 2023-02-28 DIAGNOSIS — M21612 Bunion of left foot: Secondary | ICD-10-CM

## 2023-02-28 DIAGNOSIS — M21611 Bunion of right foot: Secondary | ICD-10-CM | POA: Insufficient documentation

## 2023-02-28 MED ORDER — CICLOPIROX 8 % EX SOLN: Freq: Every day | CUTANEOUS | 12 refills | Status: DC

## 2023-02-28 NOTE — Progress Notes (Signed)
    Subjective:    Patient ID: Ann Chase, female   DOB: 1951/04/28, 72 y.o.   MRN: [id number]   HPI  Ann Chase interprets   Toenail onychomycosis: does not feel her nails are any better.  She completed the 12 week course 2 days ago. She is remembering years ago, she went to another clinic before coming here and took Terbinafine for the same issue, which did not help.    2.  Continued issues with tongue discomfort:  has been evaluated extensively in past by different specialists.  We have not been able to find an abnormality to treat.  3.  Brings in paperwork from previous colonoscopy and EGD.  Reported colonoscopy in 9/?2015 with Dr. Loreta Ave and the EGD 10/2017 with Dr. Alycia Rossetti McKimmie in [place] at [facility].  Current Meds  Medication Sig   B Complex Vitamins (B COMPLEX 1 PO) B Complex  i tab po qd   Calcium Carbonate-Vitamin D 600-400 MG-UNIT tablet Take 1 tablet by mouth 2 (two) times daily. Reported on 12/03/2015   ibuprofen (ADVIL) 200 MG tablet Take 1 tablet (200 mg total) by mouth every 6 (six) hours as needed.   Multiple Vitamin (MULTIVITAMIN ADULT) TABS Orally   pantoprazole (PROTONIX) 40 MG tablet TAKE ONE TABLET BY MOUTH DAILY IN THE MORNING 30 MINUTES BEFORE BREAKFAST   Propylene Glycol (SYSTANE COMPLETE OP) Apply 1 drop to eye 2 (two) times daily.   No Known Allergies   Review of Systems    Objective:   BP 138/84 (BP Location: Left Arm, Patient Position: Sitting, Cuff Size: Normal)   Pulse 60   Resp 16   Ht 5\' 1"  (1.549 m)   Wt 143 lb (64.9 kg)   LMP  (LMP Unknown)   BMI 27.02 kg/m   Physical Exam   Left great toenail with pincer deformity/ thickening and discoloration.  Multiple smaller toes with thickened mildly discolored and crumbling toenails. 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  Did not follow up as instructed.  Hepatic profile today.  Will try Penlac instead.  Discussed may need to continue this for a year or more    2.  Bunions and Hammertoes:  Her toes are crowded together.  Encouraged her to get shoes with wide and high toe boxes.  May be expensive.  3.  GI evaluation in past:  EGD with [facility] in [place] from 2019 entered into [facility].  Sending for her colonoscopy report from Dr. Charna , GI. Appears may have been done in 2015.

## 2023-02-28 NOTE — Patient Instructions (Signed)
Ann Chase [place])

## 2023-03-01 LAB — HEPATIC FUNCTION PANEL
ALT: 15 IU/L (ref 0–32)
AST: 23 IU/L (ref 0–40)
Albumin: 4.4 g/dL (ref 3.9–4.9)
Alkaline Phosphatase: 56 IU/L (ref 44–121)
Bilirubin Total: 0.4 mg/dL (ref 0.0–1.2)
Bilirubin, Direct: 0.12 mg/dL (ref 0.00–0.40)
Total Protein: 7.3 g/dL (ref 6.0–8.5)

## 2023-03-08 ENCOUNTER — Encounter: Payer: Self-pay | Admitting: Internal Medicine

## 2023-06-14 ENCOUNTER — Other Ambulatory Visit: Payer: Self-pay

## 2023-06-14 DIAGNOSIS — Z23 Encounter for immunization: Secondary | ICD-10-CM

## 2023-06-14 DIAGNOSIS — R7303 Prediabetes: Secondary | ICD-10-CM

## 2023-06-14 MED ORDER — PANTOPRAZOLE SODIUM 40 MG PO TBEC: DELAYED_RELEASE_TABLET | ORAL | 8 refills | Status: DC

## 2023-06-15 LAB — HEMOGLOBIN A1C
Est. average glucose Bld gHb Est-mCnc: 128 mg/dL
Hgb A1c MFr Bld: 6.1 % — ABNORMAL HIGH (ref 4.8–5.6)

## 2023-06-20 ENCOUNTER — Ambulatory Visit: Payer: Self-pay | Admitting: Internal Medicine

## 2023-06-20 ENCOUNTER — Encounter: Payer: Self-pay | Admitting: Internal Medicine

## 2023-06-20 VITALS — BP 124/64 | HR 90 | Resp 16 | Ht 61.0 in | Wt 149.0 lb

## 2023-06-20 DIAGNOSIS — M21611 Bunion of right foot: Secondary | ICD-10-CM

## 2023-06-20 DIAGNOSIS — M2041 Other hammer toe(s) (acquired), right foot: Secondary | ICD-10-CM

## 2023-06-20 DIAGNOSIS — B351 Tinea unguium: Secondary | ICD-10-CM

## 2023-06-20 DIAGNOSIS — F458 Other somatoform disorders: Secondary | ICD-10-CM

## 2023-06-20 DIAGNOSIS — R7303 Prediabetes: Secondary | ICD-10-CM

## 2023-06-20 NOTE — Progress Notes (Signed)
    Subjective:    Patient ID: Ann Chase, female   DOB: [birth year], 72 y.o.   MRN: [id number]   HPI  Germaine Kohut interprets   Toenail onychomycosis:  Has been using Ciclopirox topically and feels her nails are improved.    2.  Bunions and hammertoes:  she is wearing shoes with roomier toebox with improvement of discomfort.    3.  Prediabetes:  improved blood glucose control with A1C back down to 6.1% from 6.3% in March.  States her main lifestyle change has been to walk.  She plans to continue walking even during winter months.    4.  HM:  records received from Dr. Tova Fresh, GI, showed no colonic polyps last performed in 2005.    Her FIT was negative this year and CBC normal.    5.  Jaw pain and facial pain if falls asleep sitting up.  Hurts to open her mouth when awakens at TMJ area.  She agrees she might be grinding her teeth or clenching.  Admits later she previously has been fitted for a bite guard with a dentist in past, but felt it was too tight and stopped using.    Current Meds  Medication Sig   Calcium Carbonate-Vitamin D 600-400 MG-UNIT tablet Take 1 tablet by mouth 2 (two) times daily. Reported on 12/03/2015   ciclopirox (PENLAC) 8 % solution Apply topically at bedtime. Apply over nail and surrounding skin. Apply daily over previous coat. After seven (7) days, may remove with alcohol and continue cycle.   ibuprofen (ADVIL) 200 MG tablet Take 1 tablet (200 mg total) by mouth every 6 (six) hours as needed.   loratadine (CLARITIN) 10 MG tablet Take 1 tablet (10 mg total) by mouth daily.   Multiple Vitamin (MULTIVITAMIN ADULT) TABS Orally   pantoprazole (PROTONIX) 40 MG tablet TAKE ONE TABLET BY MOUTH DAILY IN THE MORNING 30 MINUTES BEFORE BREAKFAST   Propylene Glycol (SYSTANE COMPLETE OP) Apply 1 drop to eye 2 (two) times daily.   No Known Allergies   Review of Systems    Objective:   BP 124/64 (BP Location: Left Arm, Patient Position: Sitting, Cuff Size:  Normal)   Pulse 90   Resp 16   Ht 5\' 1"  (1.549 m)   Wt 149 lb (67.6 kg)   LMP  (LMP Unknown)   BMI 28.15 kg/m   Physical Exam NAD HEENT:  No definite wear from grinding/clenching on teeth.  NT over TMJs bilaterally Neck:  supple, No adenopathy Chest:  CTA CV:  RRR without murmur or rub.  Radial pulses normal and equal LEft great toenail still thick and pincer deform Assessment & Plan   Toenail onychomycosis:  some improvement.  Discussed treatment would likely be prolonged.  2.  Bunions:  Change in shoe wear has helped with discomfort  3.  Prediabetes:  stable to improved.  4.  HM:  history of normal colonoscopy in 2005.  CBC normal and FIT negative in past year.  No interest in screening colonoscopy at this time due to cost.  5.  Jaw pain:  with her history of same, recommend purchasing a bite block in dental area of [facility] she can purchase and mold to her teeth--to wear at night.

## 2023-12-08 ENCOUNTER — Other Ambulatory Visit: Payer: Self-pay

## 2023-12-08 DIAGNOSIS — Z0000 Encounter for general adult medical examination without abnormal findings: Secondary | ICD-10-CM

## 2023-12-09 LAB — COMPREHENSIVE METABOLIC PANEL
ALT: 15 IU/L (ref 0–32)
AST: 21 IU/L (ref 0–40)
Albumin: 4.4 g/dL (ref 3.9–4.9)
Alkaline Phosphatase: 71 IU/L (ref 44–121)
BUN/Creatinine Ratio: 16 (ref 12–28)
BUN: 14 mg/dL (ref 8–27)
Bilirubin Total: 0.5 mg/dL (ref 0.0–1.2)
CO2: 22 mmol/L (ref 20–29)
Calcium: 9.3 mg/dL (ref 8.7–10.3)
Chloride: 104 mmol/L (ref 96–106)
Creatinine, Ser: 0.89 mg/dL (ref 0.57–1.00)
Globulin, Total: 2.7 g/dL (ref 1.5–4.5)
Glucose: 107 mg/dL — ABNORMAL HIGH (ref 70–99)
Potassium: 4.5 mmol/L (ref 3.5–5.2)
Sodium: 143 mmol/L (ref 134–144)
Total Protein: 7.1 g/dL (ref 6.0–8.5)
eGFR: 70 mL/min/{1.73_m2} (ref >59)

## 2023-12-09 LAB — CBC WITH DIFFERENTIAL/PLATELET
Basophils Absolute: 0 10*3/uL (ref 0.0–0.2)
Basos: 1 %
EOS (ABSOLUTE): 0.1 10*3/uL (ref 0.0–0.4)
Eos: 3 %
Hematocrit: 41.3 % (ref 34.0–46.6)
Hemoglobin: 13.9 g/dL (ref 11.1–15.9)
Immature Grans (Abs): 0 10*3/uL (ref 0.0–0.1)
Immature Granulocytes: 0 %
Lymphocytes Absolute: 1.2 10*3/uL (ref 0.7–3.1)
Lymphs: 23 %
MCH: 29.4 pg (ref 26.6–33.0)
MCHC: 33.7 g/dL (ref 31.5–35.7)
MCV: 87 fL (ref 79–97)
Monocytes Absolute: 0.4 10*3/uL (ref 0.1–0.9)
Monocytes: 8 %
Neutrophils Absolute: 3.3 10*3/uL (ref 1.4–7.0)
Neutrophils: 65 %
Platelets: 264 10*3/uL (ref 150–450)
RBC: 4.73 x10E6/uL (ref 3.77–5.28)
RDW: 13.2 % (ref 11.7–15.4)
WBC: 5 10*3/uL (ref 3.4–10.8)

## 2023-12-09 LAB — HEMOGLOBIN A1C
Est. average glucose Bld gHb Est-mCnc: 123 mg/dL
Hgb A1c MFr Bld: 5.9 % — ABNORMAL HIGH (ref 4.8–5.6)

## 2023-12-09 LAB — LIPID PANEL W/O CHOL/HDL RATIO
Cholesterol, Total: 174 mg/dL (ref 100–199)
HDL: 52 mg/dL (ref >39)
LDL Chol Calc (NIH): 101 mg/dL — ABNORMAL HIGH (ref 0–99)
Triglycerides: 117 mg/dL (ref 0–149)
VLDL Cholesterol Cal: 21 mg/dL (ref 5–40)

## 2023-12-13 ENCOUNTER — Ambulatory Visit: Payer: Self-pay | Admitting: Internal Medicine

## 2023-12-13 ENCOUNTER — Encounter: Payer: Self-pay | Admitting: Internal Medicine

## 2023-12-13 VITALS — BP 130/80 | HR 81 | Resp 15 | Ht 60.75 in | Wt 148.0 lb

## 2023-12-13 DIAGNOSIS — Z9189 Other specified personal risk factors, not elsewhere classified: Secondary | ICD-10-CM

## 2023-12-13 DIAGNOSIS — Z0000 Encounter for general adult medical examination without abnormal findings: Secondary | ICD-10-CM

## 2023-12-13 DIAGNOSIS — R7303 Prediabetes: Secondary | ICD-10-CM

## 2023-12-13 NOTE — Progress Notes (Signed)
 Subjective:    Patient ID: Ann Chase, female   DOB: 26-Mar-1951, 73 y.o.   MRN: [id number]   HPI  [name] interprets  CPE without pap  1.  Pap:  Last in 2020 and normal.  Always normal in past.  She has aged out.    2.  Mammogram:  Last 01/2023 and normal.  She has not yet received a letter to reschedule.  No family history of breast cancer.    3.  Osteoprevention:  History of osteopenia in 2008.  She does take adequate calcium and vitamin D.  She does get out and walk when warmer.  She does get outside regularly on her back patio.    4.  Guaiac Cards/FIT:  Last 11/2022 and negative for blood.    5.  Colonoscopy:  normal save for small internal hemorrhoids with Dr. Loreta Ave in 2005.    6.  Immunizations:  Vaccines up to date. Immunization History  Administered Date(s) Administered   Fluad Trivalent(High Dose 65+) 06/14/2023   Influenza Inj Mdck Quad Pf 08/01/2019   Moderna Covid-19 Fall Seasonal Vaccine 71yrs & older 12/06/2022, 06/14/2023   Moderna Covid-19 Vaccine Bivalent Booster 37yrs & up 11/24/2021   Moderna Sars-Covid-2 Vaccination 11/11/2019, 12/10/2019, 07/20/2020   Pneumococcal Conjugate-13 08/01/2019   Pneumococcal Polysaccharide-23 08/07/2020   Tdap 06/30/2016   Zoster Recombinant(Shingrix) 05/26/2021, 11/24/2021     7.  Glucose/Cholesterol:  prediabetes with A1C back down to 5.9%.  Cholesterol at goal. Lipid Panel     Component Value Date/Time   CHOL 174 12/08/2023 0904   TRIG 117 12/08/2023 0904   HDL 52 12/08/2023 0904   CHOLHDL 3.7 Ratio 01/21/2010 1836   VLDL 28 01/21/2010 1836   LDLCALC 101 (H) 12/08/2023 0904   LABVLDL 21 12/08/2023 0904     Current Meds  Medication Sig   B Complex Vitamins (B COMPLEX 1 PO)    Calcium Carbonate-Vitamin D 600-400 MG-UNIT tablet Take 1 tablet by mouth 2 (two) times daily. Reported on 12/03/2015   ciclopirox (PENLAC) 8 % solution Apply topically at bedtime. Apply over nail and surrounding skin.  Apply daily over previous coat. After seven (7) days, may remove with alcohol and continue cycle.   ibuprofen (ADVIL) 200 MG tablet Take 1 tablet (200 mg total) by mouth every 6 (six) hours as needed.   Multiple Vitamin (MULTIVITAMIN ADULT) TABS Orally   pantoprazole (PROTONIX) 40 MG tablet TAKE ONE TABLET BY MOUTH DAILY IN THE MORNING 30 MINUTES BEFORE BREAKFAST   Propylene Glycol (SYSTANE COMPLETE OP) Apply 1 drop to eye 2 (two) times daily.   No Known Allergies  Past Medical History:  Diagnosis Date   Arthritis    GERD (gastroesophageal reflux disease)    Hyperlipidemia    Lumbar radiculopathy, right 01/26/2017   Osteopenia 2008   DEXA when had insurance   Prediabetes 2013   A1C as high as 6.0%.Weighed 20 lbs more at the time   Tongue disorder    Not clear if an organic process   Past Surgical History:  Procedure Laterality Date   BREAST BIOPSY Right 04/02/2004   COLONOSCOPY  05/28/2004   Dr. Charna Erikson Danzy:  normal save for small internal hemorrhoids   ESOPHAGOGASTRODUODENOSCOPY N/A 10/25/2017   Mild non erosive gastritis, small hiatal hernia, dilation of lower third of esophagus. Biopsies neg for H.pylori Esophageal bioipsies normal.  Maintained on prilosec   LIPOMA EXCISION N/A 1985   Past Surgical History:  Procedure Laterality Date   BREAST BIOPSY  Right 04/02/2004   COLONOSCOPY  05/28/2004   Dr. Charna Dollene Mallery:  normal save for small internal hemorrhoids   ESOPHAGOGASTRODUODENOSCOPY N/A 10/25/2017   Mild non erosive gastritis, small hiatal hernia, dilation of lower third of esophagus. Biopsies neg for H.pylori Esophageal bioipsies normal.  Maintained on prilosec   LIPOMA EXCISION N/A 1985   Family History  Problem Relation Age of Onset   Cirrhosis Father    Hepatitis Father        Unknown viral etiology   Osteoporosis Sister    Diabetes Brother    Benign prostatic hyperplasia Brother    Hyperlipidemia Son    Diabetes Son    Hypertension Son    Diabetes Son     Hypertension Son    Obesity Son    Liver disease Son        History of elevated Tbil without pain.  Resolved   Breast cancer Neg Hx    Social History   Socioeconomic History   Marital status: Legally Separated    Spouse name: Not on file   Number of children: 3   Years of education: 8   Highest education level: Not on file  Occupational History   Occupation: Airline pilot    Comment: part time- Does not wear gloves when washing with harsh chemicals.  Tobacco Use   Smoking status: Never    Passive exposure: Never   Smokeless tobacco: Never  Vaping Use   Vaping status: Never Used  Substance and Sexual Activity   Alcohol use: No   Drug use: No   Sexual activity: Not Currently  Other Topics Concern   Not on file  Social History Narrative   One son (middle son) lives near her   She lives alone in her own home.   No friends live nearby--her friendships are with her coworkers   Her son's family has made sure she has food and money during her nonworking time of the pandemic.     No current needs.      Social Drivers of Corporate investment banker Strain: Low Risk  (12/13/2023)   Overall Financial Resource Strain (CARDIA)    Difficulty of Paying Living Expenses: Not very hard  Food Insecurity: No Food Insecurity (12/13/2023)   Hunger Vital Sign    Worried About Running Out of Food in the Last Year: Never true    Ran Out of Food in the Last Year: Never true  Transportation Needs: Unmet Transportation Needs (12/13/2023)   PRAPARE - Administrator, Civil Service (Medical): No    Lack of Transportation (Non-Medical): Yes  Physical Activity: Not on file  Stress: Not on file  Social Connections: Unknown (08/01/2019)   Social Connection and Isolation Panel [NHANES]    Frequency of Communication with Friends and Family: Not on file    Frequency of Social Gatherings with Friends and Family: Not on file    Attends Religious Services: Not on file    Active Member of  Clubs or Organizations: Not on file    Attends Banker Meetings: Not on file    Marital Status: Separated  Intimate Partner Violence: Not At Risk (12/13/2023)   Humiliation, Afraid, Rape, and Kick questionnaire    Fear of Current or Ex-Partner: No    Emotionally Abused: No    Physically Abused: No    Sexually Abused: No      Review of Systems  HENT:  Negative for dental problem.   Eyes:  Negative  for visual disturbance (vision good with lenses.  [employer]).  Respiratory:  Negative for shortness of breath.   Cardiovascular:  Positive for leg swelling (States when not walking regularly.). Negative for chest pain and palpitations.       Does get "air" in mid sternal area rarely and resolves with belching  Gastrointestinal:  Negative for abdominal pain and blood in stool (No melena).  Neurological:  Negative for weakness and numbness.  Psychiatric/Behavioral:  Negative for dysphoric mood. The patient is not nervous/anxious.       Objective:   BP 130/80 (BP Location: Left Arm, Patient Position: Sitting, Cuff Size: Normal)   Pulse 81   Resp 15   Ht 5' 0.75" (1.543 m)   Wt 148 lb (67.1 kg)   LMP  (LMP Unknown)   BMI 28.19 kg/m   Physical Exam HENT:     Head: Normocephalic and atraumatic.     Right Ear: Tympanic membrane, ear canal and external ear normal.     Left Ear: Tympanic membrane, ear canal and external ear normal.     Nose: Nose normal.     Mouth/Throat:     Mouth: Mucous membranes are dry.     Pharynx: Oropharynx is clear.     Comments: Missing teeth Eyes:     Extraocular Movements: Extraocular movements intact.     Conjunctiva/sclera: Conjunctivae normal.     Pupils: Pupils are equal, round, and reactive to light.     Comments: Discs sharp  Neck:     Thyroid: No thyroid mass or thyromegaly.  Cardiovascular:     Rate and Rhythm: Normal rate and regular rhythm.     Heart sounds: S1 normal and S2 normal. No murmur heard.    No friction rub. No S3 or  S4 sounds.     Comments: No carotid bruits.  Carotid, radial, femoral, DP and PT pulses normal and equal.   Pulmonary:     Effort: Pulmonary effort is normal.     Breath sounds: Normal breath sounds and air entry.  Chest:  Breasts:    Right: No inverted nipple, mass or nipple discharge.     Left: No inverted nipple, mass or nipple discharge.  Abdominal:     General: Bowel sounds are normal.     Palpations: Abdomen is soft. There is no hepatomegaly, splenomegaly or mass.     Tenderness: There is no abdominal tenderness.     Hernia: No hernia is present.  Genitourinary:    General: Normal vulva.     Comments: No uterine or adnexal mass or tenderness Musculoskeletal:        General: Normal range of motion.     Cervical back: Normal range of motion and neck supple.     Right lower leg: No edema.     Left lower leg: No edema.  Feet:     Comments: Thickened great toenails with pincer deformity. Lymphadenopathy:     Head:     Right side of head: No submental or submandibular adenopathy.     Left side of head: No submental or submandibular adenopathy.     Cervical: No cervical adenopathy.     Upper Body:     Right upper body: No supraclavicular or axillary adenopathy.     Left upper body: No supraclavicular or axillary adenopathy.     Lower Body: No right inguinal adenopathy. No left inguinal adenopathy.  Skin:    General: Skin is warm.     Capillary Refill: Capillary refill  takes less than 2 seconds.     Findings: No rash.  Neurological:     General: No focal deficit present.     Mental Status: She is alert and oriented to person, place, and time.     Cranial Nerves: Cranial nerves 2-12 are intact.     Sensory: Sensation is intact.     Motor: Motor function is intact.     Coordination: Coordination is intact.     Gait: Gait is intact.     Deep Tendon Reflexes: Reflexes are normal and symmetric.  Psychiatric:        Mood and Affect: Mood normal.        Speech: Speech normal.         Behavior: Behavior normal. Behavior is cooperative.      Assessment & Plan   CPE without pap Mammogram in May CBC, CMP normal.  Cholesterol at goal. Referral to Dental clinic.  2.  Prediabetes:  improved A1C  3.  Osteopenia:  Vitamin D level okay in 2023.  No coverage for repeat DEXA.  4.  Toenail onychomycosis:  continue ciclopirox.

## 2023-12-20 ENCOUNTER — Other Ambulatory Visit: Payer: Self-pay

## 2023-12-20 DIAGNOSIS — Z1211 Encounter for screening for malignant neoplasm of colon: Secondary | ICD-10-CM

## 2023-12-20 LAB — POC FIT TEST STOOL: Fecal Occult Blood: NEGATIVE

## 2024-03-18 ENCOUNTER — Other Ambulatory Visit: Payer: Self-pay | Admitting: Internal Medicine

## 2024-03-20 ENCOUNTER — Telehealth: Payer: Self-pay | Admitting: Internal Medicine

## 2024-03-20 NOTE — Telephone Encounter (Signed)
 Patient needs medication refill for Pantoprazole .

## 2024-03-21 NOTE — Telephone Encounter (Signed)
 Sent earlier-notify Pharmacy already sent request

## 2024-03-21 NOTE — Telephone Encounter (Signed)
Patient has been notified prescription sent to pharmacy 

## 2024-03-26 ENCOUNTER — Telehealth: Payer: Self-pay

## 2024-03-26 NOTE — Telephone Encounter (Signed)
 Telephoned patient at mobile number, using interpreter Francee Sprung. Patient requested mammogram scholarship application. Patient's address confirmed. [facility]

## 2024-03-29 ENCOUNTER — Other Ambulatory Visit: Payer: Self-pay | Admitting: Internal Medicine

## 2024-03-29 MED ORDER — CICLOPIROX 8 % EX SOLN: Freq: Every day | CUTANEOUS | 12 refills | Status: AC

## 2024-04-18 ENCOUNTER — Other Ambulatory Visit: Payer: Self-pay | Admitting: Internal Medicine

## 2024-04-18 DIAGNOSIS — Z1231 Encounter for screening mammogram for malignant neoplasm of breast: Secondary | ICD-10-CM

## 2024-06-06 ENCOUNTER — Encounter

## 2024-06-20 ENCOUNTER — Ambulatory Visit
Admission: RE | Admit: 2024-06-20 | Discharge: 2024-06-20 | Disposition: A | Discharge status: Home / Self Care | Source: Ambulatory Visit | Attending: Internal Medicine | Admitting: Internal Medicine

## 2024-06-20 DIAGNOSIS — Z1231 Encounter for screening mammogram for malignant neoplasm of breast: Secondary | ICD-10-CM

## 2024-07-05 ENCOUNTER — Ambulatory Visit: Payer: Self-pay

## 2024-07-11 ENCOUNTER — Ambulatory Visit (INDEPENDENT_AMBULATORY_CARE_PROVIDER_SITE_OTHER): Payer: Self-pay | Admitting: Internal Medicine

## 2024-07-11 DIAGNOSIS — Z23 Encounter for immunization: Secondary | ICD-10-CM

## 2024-12-12 ENCOUNTER — Other Ambulatory Visit: Payer: Self-pay

## 2024-12-17 ENCOUNTER — Encounter: Payer: Self-pay | Admitting: Internal Medicine
